# Patient Record
Sex: Female | Born: 1965
Health system: Southern US, Community
[De-identification: ages and names within clinical notes are randomized; demographics above are authoritative.]

## PROBLEM LIST (undated history)

## (undated) DIAGNOSIS — M1990 Unspecified osteoarthritis, unspecified site: Secondary | ICD-10-CM

## (undated) DIAGNOSIS — T7840XA Allergy, unspecified, initial encounter: Secondary | ICD-10-CM

## (undated) DIAGNOSIS — M722 Plantar fascial fibromatosis: Secondary | ICD-10-CM

## (undated) DIAGNOSIS — M7751 Other enthesopathy of right foot: Secondary | ICD-10-CM

## (undated) HISTORY — DX: Unspecified osteoarthritis, unspecified site: M19.90

## (undated) HISTORY — DX: Plantar fascial fibromatosis: M72.2

## (undated) HISTORY — PX: BREAST CYST ASPIRATION: SHX578

## (undated) HISTORY — DX: Other enthesopathy of right foot and ankle: M77.51

## (undated) HISTORY — PX: COLONOSCOPY: SHX174

## (undated) HISTORY — DX: Allergy, unspecified, initial encounter: T78.40XA

## (undated) HISTORY — PX: OTHER SURGICAL HISTORY: SHX169

## (undated) HISTORY — PX: POLYPECTOMY: SHX149

---

## 2016-02-20 ENCOUNTER — Other Ambulatory Visit: Payer: Self-pay | Admitting: Medical

## 2016-02-20 DIAGNOSIS — Z1231 Encounter for screening mammogram for malignant neoplasm of breast: Secondary | ICD-10-CM

## 2016-02-22 ENCOUNTER — Ambulatory Visit
Admission: RE | Admit: 2016-02-22 | Discharge: 2016-02-22 | Disposition: A | Discharge status: Home / Self Care | Payer: BLUE CROSS/BLUE SHIELD | Source: Ambulatory Visit | Attending: Medical | Admitting: Medical

## 2016-02-22 DIAGNOSIS — Z1231 Encounter for screening mammogram for malignant neoplasm of breast: Secondary | ICD-10-CM | POA: Insufficient documentation

## 2016-03-13 ENCOUNTER — Ambulatory Visit
Admission: RE | Admit: 2016-03-13 | Discharge: 2016-03-13 | Disposition: A | Discharge status: Home / Self Care | Payer: BLUE CROSS/BLUE SHIELD | Source: Ambulatory Visit | Attending: Medical | Admitting: Medical

## 2016-03-13 ENCOUNTER — Other Ambulatory Visit: Payer: Self-pay | Admitting: Medical

## 2016-03-13 DIAGNOSIS — M25561 Pain in right knee: Secondary | ICD-10-CM

## 2016-03-13 DIAGNOSIS — M1711 Unilateral primary osteoarthritis, right knee: Secondary | ICD-10-CM | POA: Insufficient documentation

## 2016-04-30 ENCOUNTER — Ambulatory Visit: Payer: BLUE CROSS/BLUE SHIELD | Attending: Medical | Admitting: Physical Therapy

## 2016-04-30 DIAGNOSIS — M25562 Pain in left knee: Secondary | ICD-10-CM | POA: Diagnosis present

## 2016-04-30 DIAGNOSIS — R262 Difficulty in walking, not elsewhere classified: Secondary | ICD-10-CM | POA: Insufficient documentation

## 2016-04-30 DIAGNOSIS — M25561 Pain in right knee: Secondary | ICD-10-CM | POA: Insufficient documentation

## 2016-04-30 NOTE — Patient Instructions (Signed)
All exercises provided were adapted from [email or web address]. Patient was provided a written handout with pictures as described. Any additional cues were manually entered in to handout and copied in to this document.  Patellar Mobilization  Relax the knee cap and gently move the knee cap (painlessly) by lifting anf gliding the bone.    SEATED CALF STRETCH - SOLEUS  While sitting, use a towel or other strap looped around your foot. Gently pull your ankle back until a stretch is felt along the back of your lower leg.   Your knee should be slightly bent the entire time.   PRONE KNEE FLEXION STRETCH WITH BELT - ANKLE ANCHORED  Start by lying on your stomach with a strap or 2 belts linked together and looped it around your affected side ankle.  Next, use the belt to pull the knee into a bent position allowing for a stretch as shown.

## 2016-05-01 NOTE — Therapy (Addendum)
[place] [facility] [address] [place], [address] Phone: [phone]   Fax:  [phone]  Physical Therapy Evaluation  Patient Details  Name: Meredith Spence MRN: [id number] Date of Birth: 10/03/1966 No Data Recorded  Encounter Date: 04/30/2016      PT End of Session - 04/30/16 [id number]    Visit Number 1   Number of Visits 9   Date for PT Re-Evaluation 06/04/16   PT Start Time 0803   PT Stop Time [id number]   PT Time Calculation (min) 52 min   Activity Tolerance Patient tolerated treatment well   Behavior During Therapy [facility] for tasks assessed/performed      No past medical history on file.  Past Surgical History  Procedure Laterality Date  . Breast cyst aspiration Left     neg    There were no vitals filed for this visit.       Subjective Assessment - 04/30/16 0943    Subjective Patient reports she began developing bilateral knee pain in the past 2-3 years. She has been active up until this point, including using weight machines and ellipticals. She prefers the elliptical, however she believes that excessive use caused her to have increased knee pain. She reports no trauma, chronic low back pain which seems unrelated to knee pain. Denies numbness/tingling or previous LE injury.    Limitations Walking   Diagnostic tests X-ray on R, no acute findings, some degenerative changes.    Patient Stated Goals To get back to a gym routine to lose some of the weight she has gained.    Currently in Pain? No/denies  No pain at rest, pain only with ascending/descending steps.             [facility] PT Assessment - 04/30/16 0944    Assessment   Medical Diagnosis --  Patellofemoral pain syndrome bilateral   Precautions   Precautions None   Restrictions   Weight Bearing Restrictions No   Balance Screen   Has the patient fallen in the past 6 months No   Has the patient had a decrease in activity level because of a fear of  falling?  Yes   [place] residence   Prior Function   Level of Independence Independent   Vocation Full time employment   Vocation Requirements --  Scientist, water quality   Leisure --  Reading, traveling.    Cognition   Overall Cognitive Status Within Functional Limits for tasks assessed   Observation/Other Assessments   Lower Extremity Functional Scale  63   Sensation   Light Touch Appears Intact   Step Up   Comments --  After ther-ex minimal pain   Step Down   Comments --  After ther-ex minimal pain   Sit to Stand   Comments --  Pain initially, quad dominant and valgus, after TE pain less   Strength   Overall Strength Comments --  No deficits in MMT   Palpation   Patella mobility --  Patellar mobility decreased with medial glides   Spinal mobility --  No overt hypomobility or pain   Saralyn Pilar (FABER) Test   Findings Negative   Ely's Test   Findings Positive   Side Right;Left   Step-up/Step Down    Findings Positive   Side  Right;Left      Manual DF stretching bilaterally 3 sets x 10 repetitions  Patellar mobilizations bilaterally (medial glides only) 5 rounds x 15-30"  Ely's position quadricep stretching 3 bouts x 10 repetitions on RLE **Educated patient on seated calf stretch and prone quad stretch for HEP.    Patient able to ascend/descend steps reciprocally with no HHA and minimal pain reported after session completed.                      PT Education - 04/30/16 [phone]    Education provided Yes   Education Details Contributing factors to anterior knee pain, HEP   Person(s) Educated Patient   Methods Explanation;Demonstration;Handout   Comprehension Verbalized understanding;Returned demonstration            PT Long Term Goals - 05/02/16 1102    PT LONG TERM GOAL #1   Title Patient will report an LEFs score of greater than 72/80 to demonstrate increased tolerance for ADLs.    Baseline 63/80   Time 4    Period Weeks   Status New   PT LONG TERM GOAL #2   Title Patient will ascend/descend 12 steps with no increase in pain and reciprocal pattern to complete household mobility.    Baseline Pain with all stair ascent/descent.    Time 4   Period Weeks   Status New   PT LONG TERM GOAL #3   Title Patient will participate in regular exercise program with no increase in pain to complete recreational activities.    Time 4   Period Weeks   Status New                  Plan - 04/30/16 0939    Clinical Impression Statement Patient presents with progressive insidious onset of bilateral knee pain R>L. She has a history of using an elliptical and wearing high heels and is noted to have decreased soft tissue extrensibility of her quadriceps (R>L) and ankle ROM into DF. She reports her pain is primarily with ascending/descending steps, which is alleviated after stretching program to target identified mobility deficits. She does have grinding sensation in patellofemoral joint as well, which may benefit from patellar mobilizations and alterations in motor patterning. Patient would benefit from skilled PT services to address the above deficits.    Rehab Potential Good   Clinical Impairments Affecting Rehab Potential Quick response in this session, chronic pain.   PT Frequency 2x / week   PT Duration 4 weeks   PT Treatment/Interventions Therapeutic exercise;Therapeutic activities;Manual techniques;Taping;Stair training;Gait training   PT Next Visit Plan Patellar mobilizations, educate on technique for elliptical, quad and calf stretching, glute strengthening.    PT Home Exercise Plan See patient instructions.    Consulted and Agree with Plan of Care Patient      Patient will benefit from skilled therapeutic intervention in order to improve the following deficits and impairments:  Difficulty walking, Pain, Improper body mechanics, Decreased strength, Impaired flexibility  Visit Diagnosis: Pain  in right knee - Plan: PT plan of care cert/re-cert  Pain in left knee - Plan: PT plan of care cert/re-cert  Difficulty in walking, not elsewhere classified - Plan: PT plan of care cert/re-cert     Problem List There are no active problems to display for this patient.  Kerman Passey, PT, DPT    05/01/2016, 3:45 PM  [place] [facility] [address] [place], [address] Phone: [phone]   Fax:  [phone]  Name: Meredith Spence MRN: [id number] Date of Birth: [birth year]  Late addition entry of Long term goals  Kerman Passey, PT, DPT

## 2016-05-02 ENCOUNTER — Ambulatory Visit: Payer: BLUE CROSS/BLUE SHIELD | Admitting: Physical Therapy

## 2016-05-02 DIAGNOSIS — R262 Difficulty in walking, not elsewhere classified: Secondary | ICD-10-CM

## 2016-05-02 DIAGNOSIS — M25561 Pain in right knee: Secondary | ICD-10-CM

## 2016-05-02 DIAGNOSIS — M25562 Pain in left knee: Secondary | ICD-10-CM

## 2016-05-02 NOTE — Therapy (Signed)
[place] [facility] [address] [place], [address] Phone: [phone]   Fax:  [phone]  Physical Therapy Treatment  Patient Details  Name: Meredith Spence MRN: [id number] Date of Birth: 1966/01/15 No Data Recorded  Encounter Date: 05/02/2016      PT End of Session - 05/02/16 0948    Visit Number 2   Number of Visits 9   Date for PT Re-Evaluation 06/04/16   PT Start Time 0903   PT Stop Time 0945   PT Time Calculation (min) 42 min   Activity Tolerance Patient tolerated treatment well   Behavior During Therapy [facility] for tasks assessed/performed      No past medical history on file.  Past Surgical History  Procedure Laterality Date  . Breast cyst aspiration Left     neg    There were no vitals filed for this visit.      Subjective Assessment - 05/02/16 0922    Subjective Patient reports she had some soreness in her calves after initial session, which has reduced. She reports she is experiencing less pain with stair ascent/descent. She has been diligent with HEP, noticing more L knee pain than R currently, she reports possibly because she is performing stretches on R moreso.    Limitations Walking   Diagnostic tests X-ray on R, no acute findings, some degenerative changes.    Patient Stated Goals To get back to a gym routine to lose some of the weight she has gained.    Currently in Pain? No/denies  Not at rest, just with stair ascent.       Patellar mobilizations to medial direction bilaterally 4 bouts on R side, 2 on L side. Notable reduction in resistance after repetitions on R side, no pain with either direction.   Sidelying clamshells 2 sets x 12 repetitions bilaterally with red t-band   Stair ascent/descent -- noted to have increased R knee pain on ascent, no pain on descent.   Standing quad stretches 2 bouts x 8 repetitions with 5" holds bilaterally ( reported decreased pain, particularly in R knee  afterwards)  Assessed squats to chair, narrow BOS with valgus noted. Cued for wider stance which reduced grinding in her knees, added R tband around her knees x 10 repetitions (felt in glute med as well) No pain reported.   Educated patient on performing standing knee flexion stretch using her hands to complete x 5 on R side, noted a stretch but not pain in her RLE.                             PT Education - 05/02/16 1058    Education provided Yes   Education Details Progression of HEP to clamshells, standing knee flexion stretch.    Person(s) Educated Patient   Methods Explanation;Demonstration;Handout   Comprehension Verbalized understanding;Returned demonstration             PT Long Term Goals - 05/02/16 1102    PT LONG TERM GOAL #1   Title Patient will report an LEFs score of greater than 72/80 to demonstrate increased tolerance for ADLs.    Baseline 63/80   Time 4   Period Weeks   Status New   PT LONG TERM GOAL #2   Title Patient will ascend/descend 12 steps with no increase in pain and reciprocal pattern to complete household mobility.    Baseline Pain with all stair ascent/descent.  Time 4   Period Weeks   Status New   PT LONG TERM GOAL #3   Title Patient will participate in regular exercise program with no increase in pain to complete recreational activities.    Time 4   Period Weeks   Status New               Plan - 05/02/16 1105    Clinical Impression Statement Patient reports decreased R knee pain after knee flexion stretching on this session. She also demonstrates decreased grinding with wider stance sit to stands, continues with narrow stance sit to stand and stair ascent/descent. She was provided with initial postero-lateral hip strengthening program today to address hip strength defiicts and tolerated well.    Rehab Potential Good   Clinical Impairments Affecting Rehab Potential Quick response in this session, chronic pain.    PT Frequency 2x / week   PT Duration 4 weeks   PT Treatment/Interventions Therapeutic exercise;Therapeutic activities;Manual techniques;Taping;Stair training;Gait training   PT Next Visit Plan Patellar mobilizations, educate on technique for elliptical, quad and calf stretching, glute strengthening.    PT Home Exercise Plan See patient instructions.    Consulted and Agree with Plan of Care Patient      Patient will benefit from skilled therapeutic intervention in order to improve the following deficits and impairments:  Difficulty walking, Pain, Improper body mechanics, Decreased strength, Impaired flexibility  Visit Diagnosis: Pain in left knee  Pain in right knee  Difficulty in walking, not elsewhere classified     Problem List There are no active problems to display for this patient.  Kerman Passey, PT, DPT    05/02/2016, 11:55 AM  [place] [facility] [address] [place], [address] Phone: [phone]   Fax:  [phone]  Name: Urja Bustos MRN: [id number] Date of Birth: 1966-03-09

## 2016-05-07 ENCOUNTER — Encounter: Payer: BLUE CROSS/BLUE SHIELD | Admitting: Physical Therapy

## 2016-05-09 ENCOUNTER — Ambulatory Visit: Payer: BLUE CROSS/BLUE SHIELD | Admitting: Physical Therapy

## 2016-05-09 ENCOUNTER — Encounter: Payer: BLUE CROSS/BLUE SHIELD | Admitting: Physical Therapy

## 2016-05-09 DIAGNOSIS — R262 Difficulty in walking, not elsewhere classified: Secondary | ICD-10-CM

## 2016-05-09 DIAGNOSIS — M25561 Pain in right knee: Secondary | ICD-10-CM

## 2016-05-09 DIAGNOSIS — M25562 Pain in left knee: Secondary | ICD-10-CM

## 2016-05-13 NOTE — Therapy (Signed)
[place] [facility] [address] [place], [address] Phone: [phone]   Fax:  [phone]  Physical Therapy Treatment  Patient Details  Name: Weldon Sohmer MRN: [id number] Date of Birth: November 14, 1966 No Data Recorded  Encounter Date: 05/09/2016      PT End of Session - 05/13/16 0958    Visit Number 3   Number of Visits 9   Date for PT Re-Evaluation 06/04/16   PT Start Time [id number]   PT Stop Time 1835   PT Time Calculation (min) 40 min   Activity Tolerance Patient tolerated treatment well   Behavior During Therapy [facility] for tasks assessed/performed      No past medical history on file.  Past Surgical History  Procedure Laterality Date  . Breast cyst aspiration Left     neg    There were no vitals filed for this visit.      Subjective Assessment - 05/13/16 1006    Subjective Patient reports she has been able to go up and down the steps without pain at home. She can notice    Limitations Walking   Diagnostic tests X-ray on R, no acute findings, some degenerative changes.    Patient Stated Goals To get back to a gym routine to lose some of the weight she has gained.    Currently in Pain? No/denies           Standing hip abduction x 12 for 2 sets with red- tband and unilateral HHA.   Side planks x 10" fot 3 sets bilaterally (patient found quite challenging, cuing for use of other hand for support, keeping trunk in neutral).   Band walks with red theraband x 10 repetitions for 2 rounds, completed 2 sets total. -- Patient reported she could feel this activating her gluteal and postero-lateral hip musculature.   Deadlift technique with 20# KB, used PVC pipe to reinforce 3 points of contact and maintaining vertical trunk position  As she was initially noted to be rounding in lumbar spine. With cuing and pictures provided patient was able to complete with improved technique, no pain in lumbar spine and  activation of glutes/hamstrings to complete lift. Squatting continues to be mildly uncomfortable for her.   Single leg deadlifts 3 sets x 10 repetitions with single HHA, cuing for maintaining straight line between ankle and ear   Assessed stair ascent/descent. No pain with descent in this session, very minimal with ascent. Educated patient to have heel down and more upright chest, though this did not necessarily change her symptoms on ascent.                        PT Education - 05/13/16 0957    Education provided Yes   Education Details Progressed HEP, educated to begin deadlifting items to pick up off the ground rather than squatting.    Person(s) Educated Patient   Methods Explanation;Demonstration;Handout   Comprehension Returned demonstration;Verbalized understanding             PT Long Term Goals - 05/02/16 1102    PT LONG TERM GOAL #1   Title Patient will report an LEFs score of greater than 72/80 to demonstrate increased tolerance for ADLs.    Baseline 63/80   Time 4   Period Weeks   Status New   PT LONG TERM GOAL #2   Title Patient will ascend/descend 12 steps with no increase in pain and reciprocal pattern  to complete household mobility.    Baseline Pain with all stair ascent/descent.    Time 4   Period Weeks   Status New   PT LONG TERM GOAL #3   Title Patient will participate in regular exercise program with no increase in pain to complete recreational activities.    Time 4   Period Weeks   Status New               Plan - 05/13/16 [id number]    Clinical Impression Statement Patient is reporting significant improvement in symptoms, she does struggle to complete sit to stand on single leg without UE support. She is progressing well with postero-lateral hip strengthening program, and is now able to progress to static and dynamic resisted upright strengthening activities.    Rehab Potential Good   Clinical Impairments Affecting Rehab Potential  Quick response in this session, chronic pain.   PT Frequency 2x / week   PT Duration 4 weeks   PT Treatment/Interventions Therapeutic exercise;Therapeutic activities;Manual techniques;Taping;Stair training;Gait training   PT Next Visit Plan Patellar mobilizations, educate on technique for elliptical, quad and calf stretching, glute strengthening.    PT Home Exercise Plan Sit to stands single leg, single leg deadlifts, patellar mobilizations, band walks, side planks.    Consulted and Agree with Plan of Care Patient      Patient will benefit from skilled therapeutic intervention in order to improve the following deficits and impairments:  Difficulty walking, Pain, Improper body mechanics, Decreased strength, Impaired flexibility  Visit Diagnosis: Pain in left knee  Pain in right knee  Difficulty in walking, not elsewhere classified     Problem List There are no active problems to display for this patient.  Kerman Passey, PT, DPT    05/13/2016, 10:06 AM  [place] [facility] [address] [place], [address] Phone: [phone]   Fax:  [phone]  Name: Klover Morss MRN: [id number] Date of Birth: [birth year]

## 2016-05-16 ENCOUNTER — Ambulatory Visit: Payer: BLUE CROSS/BLUE SHIELD | Admitting: Physical Therapy

## 2016-05-16 DIAGNOSIS — M25561 Pain in right knee: Secondary | ICD-10-CM | POA: Diagnosis not present

## 2016-05-16 DIAGNOSIS — R262 Difficulty in walking, not elsewhere classified: Secondary | ICD-10-CM

## 2016-05-16 DIAGNOSIS — M25562 Pain in left knee: Secondary | ICD-10-CM

## 2016-05-19 NOTE — Therapy (Signed)
[place] [facility] [address] [place], [address] Phone: [phone]   Fax:  [phone]  Physical Therapy Treatment  Patient Details  Name: Meredith Spence MRN: [id number] Date of Birth: 12-27-1965 No Data Recorded  Encounter Date: 05/16/2016      PT End of Session - 05/19/16 2258    Visit Number 4   Number of Visits 9   Date for PT Re-Evaluation 06/04/16   PT Start Time 9924   PT Stop Time 1800   PT Time Calculation (min) 15 min   Activity Tolerance Patient tolerated treatment well   Behavior During Therapy [facility] for tasks assessed/performed      No past medical history on file.  Past Surgical History  Procedure Laterality Date  . Breast cyst aspiration Left     neg    There were no vitals filed for this visit.      Subjective Assessment - 05/19/16 2302    Subjective Patient informs PT she is now able to ascend/descend steps at home with no antalgic gait, even her husband has noticed. She has had one minor incident of knee pain over the past 1-2 weeks, no other reports of pain. She states she feels she no longer needs therapy and can resume her previous activities.    Limitations Walking   Diagnostic tests X-ray on R, no acute findings, some degenerative changes.    Patient Stated Goals To get back to a gym routine to lose some of the weight she has gained.    Currently in Pain? No/denies            Patient completed LEFS - 76/80 She was able to ascend/descend 16 steps without use of UEs with no reports of pain.  Discussed maintenance program, specifically to continue with HEP every other day or every 3 days as well as returning to previous level of activity. She was instructed to try rowing ergometer as tolerated in lieu of elliptical.                      PT Education - 05/19/16 2258    Education provided Yes   Education Details To continue HEP, she can decline to every other  day. Use rowing ergometer rather than elliptical if possible.    Person(s) Educated Patient   Methods Explanation   Comprehension Verbalized understanding             PT Long Term Goals - 05/16/16 1809    PT LONG TERM GOAL #1   Title Patient will report an LEFs score of greater than 72/80 to demonstrate increased tolerance for ADLs.    Baseline 63/80, 76/80 on 6/29   Time 4   Period Weeks   Status Achieved   PT LONG TERM GOAL #2   Title Patient will ascend/descend 12 steps with no increase in pain and reciprocal pattern to complete household mobility.    Baseline Pain with all stair ascent/descent.    Time 4   Period Weeks   Status Achieved   PT LONG TERM GOAL #3   Title Patient will participate in regular exercise program with no increase in pain to complete recreational activities.    Time 4   Period Weeks   Status Achieved               Plan - 05/19/16 2259    Clinical Impression Statement Patient is no longer having symptoms and reports marked  increase in LEFS to 76/80. She was instructed in maintenance protocol and to resume normal workout activities. Patient has met all goals.    Rehab Potential Good   Clinical Impairments Affecting Rehab Potential Quick response in this session, chronic pain.   PT Frequency 2x / week   PT Duration 4 weeks   PT Treatment/Interventions Therapeutic exercise;Therapeutic activities;Manual techniques;Taping;Stair training;Gait training   PT Next Visit Plan Patellar mobilizations, educate on technique for elliptical, quad and calf stretching, glute strengthening.    PT Home Exercise Plan Sit to stands single leg, single leg deadlifts, patellar mobilizations, band walks, side planks.    Consulted and Agree with Plan of Care Patient      Patient will benefit from skilled therapeutic intervention in order to improve the following deficits and impairments:  Difficulty walking, Pain, Improper body mechanics, Decreased strength,  Impaired flexibility  Visit Diagnosis: Pain in left knee  Pain in right knee  Difficulty in walking, not elsewhere classified     Problem List There are no active problems to display for this patient.  Kerman Passey, PT, DPT    05/19/2016, 11:02 PM  [place] [facility] [address], [place], [address] Phone: [phone]   Fax:  [phone]  Name: Koby Hartfield MRN: [id number] Date of Birth: [birth year]

## 2016-05-23 ENCOUNTER — Ambulatory Visit: Payer: BLUE CROSS/BLUE SHIELD | Admitting: Physical Therapy

## 2016-05-28 ENCOUNTER — Encounter: Payer: BLUE CROSS/BLUE SHIELD | Admitting: Physical Therapy

## 2016-06-04 ENCOUNTER — Encounter: Payer: BLUE CROSS/BLUE SHIELD | Admitting: Physical Therapy

## 2016-06-11 ENCOUNTER — Encounter: Payer: BLUE CROSS/BLUE SHIELD | Admitting: Physical Therapy

## 2016-06-18 ENCOUNTER — Encounter: Payer: BLUE CROSS/BLUE SHIELD | Admitting: Physical Therapy

## 2017-03-03 ENCOUNTER — Other Ambulatory Visit: Payer: Self-pay | Admitting: Medical

## 2017-03-03 DIAGNOSIS — Z1231 Encounter for screening mammogram for malignant neoplasm of breast: Secondary | ICD-10-CM

## 2017-03-05 ENCOUNTER — Ambulatory Visit
Admission: RE | Admit: 2017-03-05 | Discharge: 2017-03-05 | Disposition: A | Discharge status: Home / Self Care | Payer: BLUE CROSS/BLUE SHIELD | Source: Ambulatory Visit | Attending: Medical | Admitting: Medical

## 2017-03-05 DIAGNOSIS — Z1231 Encounter for screening mammogram for malignant neoplasm of breast: Secondary | ICD-10-CM | POA: Insufficient documentation

## 2017-12-17 ENCOUNTER — Ambulatory Visit: Payer: Self-pay | Admitting: Medical

## 2017-12-17 VITALS — BP 104/78 | HR 79 | Temp 97.5°F | Resp 16 | Ht 66.0 in | Wt 168.2 lb

## 2017-12-17 DIAGNOSIS — B349 Viral infection, unspecified: Secondary | ICD-10-CM

## 2017-12-17 NOTE — Patient Instructions (Signed)
Viral Illness, Adult Viruses are tiny germs that can get into a person's body and cause illness. There are many different types of viruses, and they cause many types of illness. Viral illnesses can range from mild to severe. They can affect various parts of the body. Common illnesses that are caused by a virus include colds and the flu. Viral illnesses also include serious conditions such as HIV/AIDS (human immunodeficiency virus/acquired immunodeficiency syndrome). A few viruses have been linked to certain cancers. What are the causes? Many types of viruses can cause illness. Viruses invade cells in your body, multiply, and cause the infected cells to malfunction or die. When the cell dies, it releases more of the virus. When this happens, you develop symptoms of the illness, and the virus continues to spread to other cells. If the virus takes over the function of the cell, it can cause the cell to divide and grow out of control, as is the case when a virus causes cancer. Different viruses get into the body in different ways. You can get a virus by:  Swallowing food or water that is contaminated with the virus.  Breathing in droplets that have been coughed or sneezed into the air by an infected person.  Touching a surface that has been contaminated with the virus and then touching your eyes, nose, or mouth.  Being bitten by an insect or animal that carries the virus.  Having sexual contact with a person who is infected with the virus.  Being exposed to blood or fluids that contain the virus, either through an open cut or during a transfusion.  If a virus enters your body, your body's defense system (immune system) will try to fight the virus. You may be at higher risk for a viral illness if your immune system is weak. What are the signs or symptoms? Symptoms vary depending on the type of virus and the location of the cells that it invades. Common symptoms of the main types of viral illnesses  include: Cold and flu viruses  Fever.  Headache.  Sore throat.  Muscle aches.  Nasal congestion.  Cough. Digestive system (gastrointestinal) viruses  Fever.  Abdominal pain.  Nausea.  Diarrhea. Liver viruses (hepatitis)  Loss of appetite.  Tiredness.  Yellowing of the skin (jaundice). Brain and spinal cord viruses  Fever.  Headache.  Stiff neck.  Nausea and vomiting.  Confusion or sleepiness. Skin viruses  Warts.  Itching.  Rash. Sexually transmitted viruses  Discharge.  Swelling.  Redness.  Rash. How is this treated? Viruses can be difficult to treat because they live within cells. Antibiotic medicines do not treat viruses because these drugs do not get inside cells. Treatment for a viral illness may include:  Resting and drinking plenty of fluids.  Medicines to relieve symptoms. These can include over-the-counter medicine for pain and fever, medicines for cough or congestion, and medicines to relieve diarrhea.  Antiviral medicines. These drugs are available only for certain types of viruses. They may help reduce flu symptoms if taken early. There are also many antiviral medicines for hepatitis and HIV/AIDS.  Some viral illnesses can be prevented with vaccinations. A common example is the flu shot. Follow these instructions at home: Medicines   Take over-the-counter and prescription medicines only as told by your health care provider.  If you were prescribed an antiviral medicine, take it as told by your health care provider. Do not stop taking the medicine even if you start to feel better.  Be aware   of when antibiotics are needed and when they are not needed. Antibiotics do not treat viruses. If your health care provider thinks that you may have a bacterial infection as well as a viral infection, you may get an antibiotic. ? Do not ask for an antibiotic prescription if you have been diagnosed with a viral illness. That will not make your  illness go away faster. ? Frequently taking antibiotics when they are not needed can lead to antibiotic resistance. When this develops, the medicine no longer works against the bacteria that it normally fights. General instructions  Drink enough fluids to keep your urine clear or pale yellow.  Rest as much as possible.  Return to your normal activities as told by your health care provider. Ask your health care provider what activities are safe for you.  Keep all follow-up visits as told by your health care provider. This is important. How is this prevented? Take these actions to reduce your risk of viral infection:  Eat a healthy diet and get enough rest.  Wash your hands often with soap and water. This is especially important when you are in public places. If soap and water are not available, use hand sanitizer.  Avoid close contact with friends and family who have a viral illness.  If you travel to areas where viral gastrointestinal infection is common, avoid drinking water or eating raw food.  Keep your immunizations up to date. Get a flu shot every year as told by your health care provider.  Do not share toothbrushes, nail clippers, razors, or needles with other people.  Always practice safe sex.  Contact a health care provider if:  You have symptoms of a viral illness that do not go away.  Your symptoms come back after going away.  Your symptoms get worse. Get help right away if:  You have trouble breathing.  You have a severe headache or a stiff neck.  You have severe vomiting or abdominal pain. This information is not intended to replace advice given to you by your health care provider. Make sure you discuss any questions you have with your health care provider. Document Released: 03/15/2016 Document Revised: 04/17/2016 Document Reviewed: 03/15/2016 Elsevier Interactive Patient Education  2018 [employer]  

## 2017-12-17 NOTE — Progress Notes (Signed)
   Subjective:    Patient ID: Meredith Spence, female    DOB: Feb 08, 1966, 52 y.o.   MRN: [id number]  HPI 52 yo female non acute distress started Friday with body aches , nasal congestion, discharge clear discharge, mild cough, mild productive at times just swallows the phellgm. Denies fever and chills.  Left eye tearing with pressure in left nostril. One time she blew her nose and the discharge was orange.  Review of Systems  Constitutional: Negative for chills and fever.  HENT: Positive for congestion, postnasal drip, rhinorrhea, sneezing, sore throat (initially scratchy , now gone) and voice change. Negative for ear pain, sinus pressure and sinus pain.   Eyes: Negative for discharge and itching.  Respiratory: Positive for cough. Negative for chest tightness and shortness of breath.   Cardiovascular: Negative for chest pain.  Gastrointestinal: Negative for abdominal pain.  Genitourinary: Negative for dysuria.  Musculoskeletal: Positive for myalgias.  Neurological: Positive for headaches (left sided headache frontal). Negative for dizziness, syncope and light-headedness.  Hematological: Negative for adenopathy.  Psychiatric/Behavioral: Negative for behavioral problems, self-injury and suicidal ideas. The patient is not nervous/anxious.        Objective:   Physical Exam  Constitutional: She is oriented to person, place, and time. She appears well-developed and well-nourished.  HENT:  Head: Normocephalic and atraumatic.  Right Ear: Hearing and ear canal normal. A middle ear effusion is present.  Left Ear: Hearing and ear canal normal. A middle ear effusion is present.  Nose: Rhinorrhea present.  Mouth/Throat: Uvula is midline, oropharynx is clear and moist and mucous membranes are normal.  Eyes: Conjunctivae and EOM are normal. Pupils are equal, round, and reactive to light.  Neck: Normal range of motion. Neck supple.  Cardiovascular: Normal rate, regular rhythm and normal heart sounds.   Pulmonary/Chest: Effort normal and breath sounds normal.  Musculoskeletal: Normal range of motion.  Lymphadenopathy:    She has no cervical adenopathy.  Neurological: She is alert and oriented to person, place, and time.  Skin: Skin is warm and dry.  Psychiatric: She has a normal mood and affect. Her behavior is normal. Judgment and thought content normal.  Nursing note and vitals reviewed.   Patient does not look well, but you can tell she has nasal congestion. Medial side of inside nares irritated vascular looking.      Assessment & Plan:  Viral illness  Wrote for  Z-pak 250 mg to take if color change of yellow or green in nasal discharge or cough or fever 101 or higher. If not improving in 14 days return to the clinic. Rest , fluids, OTC Zyrtec or Claritin take as directed. Take OTC Ibuprofen or Tylenol if none in Extra Strength Mucinex to help feel better.  Hand written prescription because patient was unsure of pharmacy road. Patient verbalizes understanding and has no questions at discharge.

## 2018-05-18 ENCOUNTER — Encounter: Payer: Self-pay | Admitting: Gastroenterology

## 2018-05-18 ENCOUNTER — Ambulatory Visit: Payer: BLUE CROSS/BLUE SHIELD | Admitting: Nurse Practitioner

## 2018-05-18 ENCOUNTER — Encounter: Payer: Self-pay | Admitting: Nurse Practitioner

## 2018-05-18 ENCOUNTER — Other Ambulatory Visit (INDEPENDENT_AMBULATORY_CARE_PROVIDER_SITE_OTHER): Payer: BLUE CROSS/BLUE SHIELD

## 2018-05-18 ENCOUNTER — Other Ambulatory Visit: Payer: Self-pay | Admitting: Nurse Practitioner

## 2018-05-18 VITALS — BP 116/72 | HR 77 | Temp 97.7°F | Resp 16 | Ht 66.0 in | Wt 169.0 lb

## 2018-05-18 DIAGNOSIS — Z0000 Encounter for general adult medical examination without abnormal findings: Secondary | ICD-10-CM

## 2018-05-18 DIAGNOSIS — R899 Unspecified abnormal finding in specimens from other organs, systems and tissues: Secondary | ICD-10-CM

## 2018-05-18 DIAGNOSIS — Z13220 Encounter for screening for lipoid disorders: Secondary | ICD-10-CM

## 2018-05-18 DIAGNOSIS — Z1239 Encounter for other screening for malignant neoplasm of breast: Secondary | ICD-10-CM

## 2018-05-18 DIAGNOSIS — Z1211 Encounter for screening for malignant neoplasm of colon: Secondary | ICD-10-CM | POA: Diagnosis not present

## 2018-05-18 DIAGNOSIS — Z23 Encounter for immunization: Secondary | ICD-10-CM

## 2018-05-18 DIAGNOSIS — R4582 Worries: Secondary | ICD-10-CM

## 2018-05-18 DIAGNOSIS — Z124 Encounter for screening for malignant neoplasm of cervix: Secondary | ICD-10-CM

## 2018-05-18 DIAGNOSIS — Z1231 Encounter for screening mammogram for malignant neoplasm of breast: Secondary | ICD-10-CM

## 2018-05-18 LAB — LIPID PANEL
Cholesterol: 214 mg/dL — ABNORMAL HIGH (ref 0–200)
HDL: 59.8 mg/dL (ref >39.00)
LDL Cholesterol: 139 mg/dL — ABNORMAL HIGH (ref 0–99)
NONHDL: 153.83
Total CHOL/HDL Ratio: 4
Triglycerides: 72 mg/dL (ref 0.0–149.0)
VLDL: 14.4 mg/dL (ref 0.0–40.0)

## 2018-05-18 LAB — COMPREHENSIVE METABOLIC PANEL
ALBUMIN: 4.2 g/dL (ref 3.5–5.2)
ALK PHOS: 42 U/L (ref 39–117)
ALT: 12 U/L (ref 0–35)
AST: 32 U/L (ref 0–37)
BILIRUBIN TOTAL: 0.6 mg/dL (ref 0.2–1.2)
BUN: 17 mg/dL (ref 6–23)
CALCIUM: 8.7 mg/dL (ref 8.4–10.5)
CO2: 27 mEq/L (ref 19–32)
CREATININE: 0.62 mg/dL (ref 0.40–1.20)
Chloride: 103 mEq/L (ref 96–112)
GFR: 107.82 mL/min (ref >60.00)
Glucose, Bld: 105 mg/dL — ABNORMAL HIGH (ref 70–99)
Potassium: 4.1 mEq/L (ref 3.5–5.1)
Sodium: 137 mEq/L (ref 135–145)
TOTAL PROTEIN: 6.7 g/dL (ref 6.0–8.3)

## 2018-05-18 LAB — CBC
HCT: 44.9 % (ref 36.0–46.0)
Hemoglobin: 15.6 g/dL — ABNORMAL HIGH (ref 12.0–15.0)
MCHC: 34.8 g/dL (ref 30.0–36.0)
MCV: 94.1 fl (ref 78.0–100.0)
PLATELETS: 263 10*3/uL (ref 150.0–400.0)
RBC: 4.77 Mil/uL (ref 3.87–5.11)
RDW: 12.4 % (ref 11.5–15.5)
WBC: 6.1 10*3/uL (ref 4.0–10.5)

## 2018-05-18 LAB — TSH: TSH: 0.89 u[IU]/mL (ref 0.35–4.50)

## 2018-05-18 NOTE — Patient Instructions (Addendum)
Please head downstairs for lab work/x-rays. If any of your test results are critically abnormal, you will be contacted right away. Otherwise, I will contact you within a week about your test results and any recommendations for abnormalities.  I have placed a referral to gastroenterology, gynecology and order for mammogram. Our office will call you to schedule this appointment. You should hear from our office in 7-10 days. Please let me know if you have not hear of these appointments/referrals after this time.  I will plan to see you back in 1 year for you annual physical, or sooner if needed.  Health Maintenance, Female Adopting a healthy lifestyle and getting preventive care can go a long way to promote health and wellness. Talk with your health care provider about what schedule of regular examinations is right for you. This is a good chance for you to check in with your provider about disease prevention and staying healthy. In between checkups, there are plenty of things you can do on your own. Experts have done a lot of research about which lifestyle changes and preventive measures are most likely to keep you healthy. Ask your health care provider for more information. Weight and diet Eat a healthy diet  Be sure to include plenty of vegetables, fruits, low-fat dairy products, and lean protein.  Do not eat a lot of foods high in solid fats, added sugars, or salt.  Get regular exercise. This is one of the most important things you can do for your health. ? Most adults should exercise for at least 150 minutes each week. The exercise should increase your heart rate and make you sweat (moderate-intensity exercise). ? Most adults should also do strengthening exercises at least twice a week. This is in addition to the moderate-intensity exercise.  Maintain a healthy weight  Body mass index (BMI) is a measurement that can be used to identify possible weight problems. It estimates body fat based on  height and weight. Your health care provider can help determine your BMI and help you achieve or maintain a healthy weight.  For females 51 years of age and older: ? A BMI below 18.5 is considered underweight. ? A BMI of 18.5 to 24.9 is normal. ? A BMI of 25 to 29.9 is considered overweight. ? A BMI of 30 and above is considered obese.  Watch levels of cholesterol and blood lipids  You should start having your blood tested for lipids and cholesterol at 51 years of age, then have this test every 5 years.  You may need to have your cholesterol levels checked more often if: ? Your lipid or cholesterol levels are high. ? You are older than 51 years of age. ? You are at high risk for heart disease.  Cancer screening Lung Cancer  Lung cancer screening is recommended for adults 90-27 years old who are at high risk for lung cancer because of a history of smoking.  A yearly low-dose CT scan of the lungs is recommended for people who: ? Currently smoke. ? Have quit within the past 15 years. ? Have at least a 30-pack-year history of smoking. A pack year is smoking an average of one pack of cigarettes a day for 1 year.  Yearly screening should continue until it has been 15 years since you quit.  Yearly screening should stop if you develop a health problem that would prevent you from having lung cancer treatment.  Breast Cancer  Practice breast self-awareness. This means understanding how your breasts  normally appear and feel.  It also means doing regular breast self-exams. Let your health care provider know about any changes, no matter how small.  If you are in your 20s or 30s, you should have a clinical breast exam (CBE) by a health care provider every 1-3 years as part of a regular health exam.  If you are 37 or older, have a CBE every year. Also consider having a breast X-ray (mammogram) every year.  If you have a family history of breast cancer, talk to your health care provider  about genetic screening.  If you are at high risk for breast cancer, talk to your health care provider about having an MRI and a mammogram every year.  Breast cancer gene (BRCA) assessment is recommended for women who have family members with BRCA-related cancers. BRCA-related cancers include: ? Breast. ? Ovarian. ? Tubal. ? Peritoneal cancers.  Results of the assessment will determine the need for genetic counseling and BRCA1 and BRCA2 testing.  Cervical Cancer Your health care provider may recommend that you be screened regularly for cancer of the pelvic organs (ovaries, uterus, and vagina). This screening involves a pelvic examination, including checking for microscopic changes to the surface of your cervix (Pap test). You may be encouraged to have this screening done every 3 years, beginning at age 7.  For women ages 73-65, health care providers may recommend pelvic exams and Pap testing every 3 years, or they may recommend the Pap and pelvic exam, combined with testing for human papilloma virus (HPV), every 5 years. Some types of HPV increase your risk of cervical cancer. Testing for HPV may also be done on women of any age with unclear Pap test results.  Other health care providers may not recommend any screening for nonpregnant women who are considered low risk for pelvic cancer and who do not have symptoms. Ask your health care provider if a screening pelvic exam is right for you.  If you have had past treatment for cervical cancer or a condition that could lead to cancer, you need Pap tests and screening for cancer for at least 20 years after your treatment. If Pap tests have been discontinued, your risk factors (such as having a new sexual partner) need to be reassessed to determine if screening should resume. Some women have medical problems that increase the chance of getting cervical cancer. In these cases, your health care provider may recommend more frequent screening and Pap  tests.  Colorectal Cancer  This type of cancer can be detected and often prevented.  Routine colorectal cancer screening usually begins at 51 years of age and continues through 51 years of age.  Your health care provider may recommend screening at an earlier age if you have risk factors for colon cancer.  Your health care provider may also recommend using home test kits to check for hidden blood in the stool.  A small camera at the end of a tube can be used to examine your colon directly (sigmoidoscopy or colonoscopy). This is done to check for the earliest forms of colorectal cancer.  Routine screening usually begins at age 19.  Direct examination of the colon should be repeated every 5-10 years through 51 years of age. However, you may need to be screened more often if early forms of precancerous polyps or small growths are found.  Skin Cancer  Check your skin from head to toe regularly.  Tell your health care provider about any new moles or changes in moles,  especially if there is a change in a mole's shape or color.  Also tell your health care provider if you have a mole that is larger than the size of a pencil eraser.  Always use sunscreen. Apply sunscreen liberally and repeatedly throughout the day.  Protect yourself by wearing long sleeves, pants, a wide-brimmed hat, and sunglasses whenever you are outside.  Heart disease, diabetes, and high blood pressure  High blood pressure causes heart disease and increases the risk of stroke. High blood pressure is more likely to develop in: ? People who have blood pressure in the high end of the normal range (130-139/85-89 mm Hg). ? People who are overweight or obese. ? People who are African American.  If you are 53-16 years of age, have your blood pressure checked every 3-5 years. If you are 29 years of age or older, have your blood pressure checked every year. You should have your blood pressure measured twice-once when you are at  a hospital or clinic, and once when you are not at a hospital or clinic. Record the average of the two measurements. To check your blood pressure when you are not at a hospital or clinic, you can use: ? An automated blood pressure machine at a pharmacy. ? A home blood pressure monitor.  If you are between 45 years and 34 years old, ask your health care provider if you should take aspirin to prevent strokes.  Have regular diabetes screenings. This involves taking a blood sample to check your fasting blood sugar level. ? If you are at a normal weight and have a low risk for diabetes, have this test once every three years after 51 years of age. ? If you are overweight and have a high risk for diabetes, consider being tested at a younger age or more often. Preventing infection Hepatitis B  If you have a higher risk for hepatitis B, you should be screened for this virus. You are considered at high risk for hepatitis B if: ? You were born in a country where hepatitis B is common. Ask your health care provider which countries are considered high risk. ? Your parents were born in a high-risk country, and you have not been immunized against hepatitis B (hepatitis B vaccine). ? You have HIV or AIDS. ? You use needles to inject street drugs. ? You live with someone who has hepatitis B. ? You have had sex with someone who has hepatitis B. ? You get hemodialysis treatment. ? You take certain medicines for conditions, including cancer, organ transplantation, and autoimmune conditions.  Hepatitis C  Blood testing is recommended for: ? Everyone born from [birth year] through 1964. ? Anyone with known risk factors for hepatitis C.  Sexually transmitted infections (STIs)  You should be screened for sexually transmitted infections (STIs) including gonorrhea and chlamydia if: ? You are sexually active and are younger than 52 years of age. ? You are older than 51 years of age and your health care provider tells  you that you are at risk for this type of infection. ? Your sexual activity has changed since you were last screened and you are at an increased risk for chlamydia or gonorrhea. Ask your health care provider if you are at risk.  If you do not have HIV, but are at risk, it may be recommended that you take a prescription medicine daily to prevent HIV infection. This is called pre-exposure prophylaxis (PrEP). You are considered at risk if: ? You are sexually  active and do not regularly use condoms or know the HIV status of your partner(s). ? You take drugs by injection. ? You are sexually active with a partner who has HIV.  Talk with your health care provider about whether you are at high risk of being infected with HIV. If you choose to begin PrEP, you should first be tested for HIV. You should then be tested every 3 months for as long as you are taking PrEP. Pregnancy  If you are premenopausal and you may become pregnant, ask your health care provider about preconception counseling.  If you may become pregnant, take 400 to 800 micrograms (mcg) of folic acid every day.  If you want to prevent pregnancy, talk to your health care provider about birth control (contraception). Osteoporosis and menopause  Osteoporosis is a disease in which the bones lose minerals and strength with aging. This can result in serious bone fractures. Your risk for osteoporosis can be identified using a bone density scan.  If you are 65 years of age or older, or if you are at risk for osteoporosis and fractures, ask your health care provider if you should be screened.  Ask your health care provider whether you should take a calcium or vitamin D supplement to lower your risk for osteoporosis.  Menopause may have certain physical symptoms and risks.  Hormone replacement therapy may reduce some of these symptoms and risks. Talk to your health care provider about whether hormone replacement therapy is right for  you. Follow these instructions at home:  Schedule regular health, dental, and eye exams.  Stay current with your immunizations.  Do not use any tobacco products including cigarettes, chewing tobacco, or electronic cigarettes.  If you are pregnant, do not drink alcohol.  If you are breastfeeding, limit how much and how often you drink alcohol.  Limit alcohol intake to no more than 1 drink per day for nonpregnant women. One drink equals 12 ounces of beer, 5 ounces of wine, or 1 ounces of hard liquor.  Do not use street drugs.  Do not share needles.  Ask your health care provider for help if you need support or information about quitting drugs.  Tell your health care provider if you often feel depressed.  Tell your health care provider if you have ever been abused or do not feel safe at home. This information is not intended to replace advice given to you by your health care provider. Make sure you discuss any questions you have with your health care provider. Document Released: 05/20/2011 Document Revised: 04/11/2016 Document Reviewed: 08/08/2015 Elsevier Interactive Patient Education  [employer].

## 2018-05-18 NOTE — Progress Notes (Signed)
Name: Meredith Spence   MRN: [id number]    DOB: July 31, 1966   Date:05/18/2018       Progress Note  Subjective  Chief Complaint  Chief Complaint  Patient presents with  . Establish Care    CPE, fasting     HPI  Meredith Spence is establishing as a new patient to our practice, prior PCP in [place]. Since moving to [place], She has been follwing with womens health provider in [place] who recently stopped practicing, and she would like referral for new GYN today. She is not maintained on any daily medications  Patient presents for annual CPE.  Diet, Exercise: low carb diet, recently gained some weight due to death in family and vacation; trying to routinely walk and go to gym.  USPSTF grade A and B recommendations  Depression: no concerns today No flowsheet data found.   Hypertension: BP Readings from Last 3 Encounters:  05/18/18 116/72  12/17/17 104/78   Obesity: Wt Readings from Last 3 Encounters:  05/18/18 169 lb (76.7 kg)  12/17/17 168 lb 3.2 oz (76.3 kg)   BMI Readings from Last 3 Encounters:  05/18/18 27.28 kg/m  12/17/17 27.15 kg/m    Alcohol: a few drinks on the weekend-fri, sat,sun Tobacco use: no, never  HIV: screening done in past  STD testing and prevention (chl/gon/syphilis): no concerns, declines screening  Intimate partner violence: declines, feels safe Menstrual History/LMP/Abnormal Bleeding: hx abnormal heavy vaginal bleeding which has been controlled since IUD placement by her prior GYN provider, will place referral to new GYN today for continued management of IUD and annual womens health care needs Incontinence Symptoms: denies  Vaccinations: TDAP today  Advanced Care Planning: A voluntary discussion about advance care planning including the explanation and discussion of advance directives.  Discussed health care proxy and Living will, and the patient DOES NOT have a living will at present time. If patient does have living will, I have requested they  bring this to the clinic to be scanned in to their chart.  Breast cancer: mammogram overdue, order placed today Cervical cancer screening:  Referral to GYN provider for annual womens health care and IUD management placed today  Lipids: lipid panel today No results found for: CHOL No results found for: HDL No results found for: LDLCALC No results found for: TRIG No results found for: CHOLHDL No results found for: LDLDIRECT  Glucose:  No results found for: GLUCOSE, GLUCAP  Skin cancer: routinely wears sunscreen  Colorectal cancer: No personal history of colon cancer, unknown family history, no abdominal pain, no bowel changes, no rectal bleeding. Colonoscopy referral placed today.  Aspirin: not indicated ECG: not indicated   Patient Active Problem List   Diagnosis Date Noted  . Routine general medical examination at a health care facility 05/18/2018    Past Surgical History:  Procedure Laterality Date  . BREAST CYST ASPIRATION Left    neg    Family History  Adopted: Yes    Social History   Socioeconomic History  . Marital status: Married    Spouse name: Not on file  . Number of children: Not on file  . Years of education: Not on file  . Highest education level: Not on file  Occupational History  . Not on file  Social Needs  . Financial resource strain: Not on file  . Food insecurity:    Worry: Not on file    Inability: Not on file  . Transportation needs:    Medical: Not on file  Non-medical: Not on file  Tobacco Use  . Smoking status: Never Smoker  . Smokeless tobacco: Never Used  Substance and Sexual Activity  . Alcohol use: Yes  . Drug use: Not Currently  . Sexual activity: Not on file  Lifestyle  . Physical activity:    Days per week: Not on file    Minutes per session: Not on file  . Stress: Not on file  Relationships  . Social connections:    Talks on phone: Not on file    Gets together: Not on file    Attends religious service: Not on  file    Active member of club or organization: Not on file    Attends meetings of clubs or organizations: Not on file    Relationship status: Not on file  . Intimate partner violence:    Fear of current or ex partner: Not on file    Emotionally abused: Not on file    Physically abused: Not on file    Forced sexual activity: Not on file  Other Topics Concern  . Not on file  Social History Narrative  . Not on file     Current Outpatient Medications:  .  levonorgestrel (MIRENA) 20 MCG/24HR IUD, 1 each by Intrauterine route once., Disp: , Rfl:   No Known Allergies   ROS  Constitutional: Negative for fever or weight change.  Respiratory: Negative for cough and shortness of breath.   Cardiovascular: Negative for chest pain or palpitations.  Gastrointestinal: Negative for abdominal pain, no bowel changes.  Musculoskeletal: Negative for gait problem or joint swelling.  Skin: Negative for rash.  Neurological: Negative for dizziness or headache.  No other specific complaints in a complete review of systems (except as listed in HPI above).   Objective  Vitals:   05/18/18 0958  BP: 116/72  Pulse: 77  Resp: 16  Temp: 97.7 F (36.5 C)  TempSrc: Oral  SpO2: 95%  Weight: 169 lb (76.7 kg)  Height: 5\' 6"  (1.676 m)    Body mass index is 27.28 kg/m.  Physical Exam Vital signs reviewed. Constitutional: Patient appears well-developed and well-nourished. No distress.  HENT: Head: Normocephalic and atraumatic. Ears: B TMs ok, no erythema or effusion; Nose: Nose normal. Mouth/Throat: Oropharynx is clear and moist. No oropharyngeal exudate.  Eyes: Conjunctivae and EOM are normal. Pupils are equal, round, and reactive to light. No scleral icterus.  Neck: Normal range of motion. Neck supple. No cervical adenopathy. No thyromegaly present.  Cardiovascular: Normal rate, regular rhythm and normal heart sounds.  No murmur heard. No BLE edema. Distal pulses intact Pulmonary/Chest: Effort  normal and breath sounds normal. No respiratory distress. Abdominal: Soft. Bowel sounds are normal, no distension. There is no tenderness. no masses Breast: defd to mammogram FEMALE GENITALIA:  defd to GYN Musculoskeletal: Normal range of motion, No gross deformities Neurological: he is alert and oriented to person, place, and time. No cranial nerve deficit. Coordination, balance, strength, speech and gait are normal.  Skin: Skin is warm and dry. No rash noted. No erythema.  Psychiatric: Patient has a normal mood and affect. behavior is normal. Judgment and thought content normal.   Assessment & Plan RTC in 1 year for CPE

## 2018-05-18 NOTE — Assessment & Plan Note (Signed)
-  USPSTF grade A and B recommendations reviewed with patient; age-appropriate recommendations, preventive care, screening tests, etc discussed and encouraged; healthy living and sunscreen use encouraged; see AVS for patient education given to patient -Discussed importance of 150 minutes of physical activity weekly, eat 6 servings of fruit/vegetables daily and drink plenty of water and avoid sweet beverages.  Follow up and care instructions discussed and provided in AVS.  -Reviewed Health Maintenance:  -Screening for cervical cancer- Ambulatory referral to Gynecology Referral to gyn for IUD management and annual womens health care -Colon cancer screening- Ambulatory referral to Gastroenterology -Screening for breast cancer- MM DIGITAL SCREENING BILATERAL; Future -Need for Tdap vaccination- Tdap vaccine greater than or equal to 7yo IM  Screening for cholesterol level Fasting today - Lipid panel; Future  Routine general medical examination at a health care facility - CBC; Future - Comprehensive metabolic panel; Future - TSH; Future-Worries

## 2018-05-26 ENCOUNTER — Other Ambulatory Visit: Payer: Self-pay | Admitting: Nurse Practitioner

## 2018-05-26 DIAGNOSIS — Z1231 Encounter for screening mammogram for malignant neoplasm of breast: Secondary | ICD-10-CM

## 2018-06-11 ENCOUNTER — Ambulatory Visit
Admission: RE | Admit: 2018-06-11 | Discharge: 2018-06-11 | Disposition: A | Discharge status: Home / Self Care | Payer: BLUE CROSS/BLUE SHIELD | Source: Ambulatory Visit | Attending: Nurse Practitioner | Admitting: Nurse Practitioner

## 2018-06-11 ENCOUNTER — Encounter: Payer: Self-pay | Admitting: Radiology

## 2018-06-11 DIAGNOSIS — Z1231 Encounter for screening mammogram for malignant neoplasm of breast: Secondary | ICD-10-CM | POA: Diagnosis not present

## 2018-06-29 ENCOUNTER — Encounter: Payer: Self-pay | Admitting: Certified Nurse Midwife

## 2018-06-30 ENCOUNTER — Ambulatory Visit: Payer: BLUE CROSS/BLUE SHIELD | Admitting: Medical

## 2018-07-03 ENCOUNTER — Encounter: Payer: Self-pay | Admitting: Certified Nurse Midwife

## 2018-07-03 ENCOUNTER — Ambulatory Visit (INDEPENDENT_AMBULATORY_CARE_PROVIDER_SITE_OTHER): Payer: BLUE CROSS/BLUE SHIELD | Admitting: Certified Nurse Midwife

## 2018-07-03 VITALS — BP 116/79 | HR 71 | Ht 66.0 in | Wt 173.3 lb

## 2018-07-03 DIAGNOSIS — Z01419 Encounter for gynecological examination (general) (routine) without abnormal findings: Secondary | ICD-10-CM

## 2018-07-03 NOTE — Progress Notes (Signed)
GYNECOLOGY ANNUAL PREVENTATIVE CARE ENCOUNTER NOTE  Subjective:   Meredith Spence is a 52 y.o. No obstetric history on file. female here for a routine annual gynecologic exam.  Current complaints: none.   Denies abnormal vaginal bleeding, discharge, pelvic pain, problems with intercourse or other gynecologic concerns. Pt states she had trauma to right breast the other day that she has a  Knot she said she cant remember what she did.    Gynecologic History No LMP recorded. (Menstrual status: IUD). Contraception: IUD mirena placed 2016 Last Pap: 04/2015. Results were: normal with negative HPV Last mammogram: 06/11/2018. Results were: normal  Obstetric History OB History  No data available    Past Medical History:  Diagnosis Date  . Allergy     Past Surgical History:  Procedure Laterality Date  . BREAST CYST ASPIRATION Left    neg    Current Outpatient Medications on File Prior to Visit  Medication Sig Dispense Refill  . levonorgestrel (MIRENA) 20 MCG/24HR IUD 1 each by Intrauterine route once.     No current facility-administered medications on file prior to visit.     No Known Allergies  Social History:  reports that she has never smoked. She has never used smokeless tobacco. She reports that she drinks alcohol. She reports that she has current or past drug history.  Drinks 2 x wk Irregular exercise No drugs or smoking  Family History  Adopted: Yes    The following portions of the patient's history were reviewed and updated as appropriate: allergies, current medications, past family history, past medical history, past social history, past surgical history and problem list.  Review of Systems Pertinent items noted in HPI and remainder of comprehensive ROS otherwise negative.   Objective:  There were no vitals taken for this visit. CONSTITUTIONAL: Well-developed, well-nourished female in no acute distress.  HENT:  Normocephalic, atraumatic, External right and left ear  normal. Oropharynx is clear and moist EYES: Conjunctivae and EOM are normal. Pupils are equal, round, and reactive to light. No scleral icterus.  NECK: Normal range of motion, supple, no masses.  Normal thyroid.  SKIN: Skin is warm and dry. No rash noted. Not diaphoretic. No erythema. No pallor. MUSCULOSKELETAL: Normal range of motion. No tenderness.  No cyanosis, clubbing, or edema.  2+ distal pulses. NEUROLOGIC: Alert and oriented to person, place, and time. Normal reflexes, muscle tone coordination. No cranial nerve deficit noted. PSYCHIATRIC: Normal mood and affect. Normal behavior. Normal judgment and thought content. CARDIOVASCULAR: Normal heart rate noted, regular rhythm RESPIRATORY: Clear to auscultation bilaterally. Effort and breath sounds normal, no problems with respiration noted. BREASTS: Symmetric in size.Right breast firm "knot" @ 11 o clock- 4 cm in size, bruising /redness noted in area. No skin changes, nipple drainage, or lymphadenopathy. ABDOMEN: Soft, normal bowel sounds, no distention noted.  No tenderness, rebound or guarding.  PELVIC: Normal appearing external genitalia; normal appearing vaginal mucosa and cervix.  No abnormal discharge noted.  Pap smear not indicated. Mirena strings present.  Normal uterine size, no other palpable masses, no uterine or adnexal tenderness.    Assessment and Plan:  Annual Well Women Exam  Pap due 2021 Labs CBC, TSH, CMP, Lipid panel done PCP7/2019. Needs Glucose-pt to follow up with PCP.  Colonoscopy: scheduled September.  Mammogram done7/2019. Follow up evaluation in 6 months if knot is still present from the trauma  Routine preventative health maintenance measures emphasized. Please refer to After Visit Summary for other counseling recommendations.   Philip Aspen, CNM

## 2018-07-03 NOTE — Progress Notes (Signed)
New pt is here for an annual exam with a pap smear. Has a Mirena in place.

## 2018-07-03 NOTE — Patient Instructions (Signed)
Preventive Care 40-64 Years, Female Preventive care refers to lifestyle choices and visits with your health care provider that can promote health and wellness. What does preventive care include?  A yearly physical exam. This is also called an annual well check.  Dental exams once or twice a year.  Routine eye exams. Ask your health care provider how often you should have your eyes checked.  Personal lifestyle choices, including: ? Daily care of your teeth and gums. ? Regular physical activity. ? Eating a healthy diet. ? Avoiding tobacco and drug use. ? Limiting alcohol use. ? Practicing safe sex. ? Taking low-dose aspirin daily starting at age 58. ? Taking vitamin and mineral supplements as recommended by your health care provider. What happens during an annual well check? The services and screenings done by your health care provider during your annual well check will depend on your age, overall health, lifestyle risk factors, and family history of disease. Counseling Your health care provider may ask you questions about your:  Alcohol use.  Tobacco use.  Drug use.  Emotional well-being.  Home and relationship well-being.  Sexual activity.  Eating habits.  Work and work Statistician.  Method of birth control.  Menstrual cycle.  Pregnancy history.  Screening You may have the following tests or measurements:  Height, weight, and BMI.  Blood pressure.  Lipid and cholesterol levels. These may be checked every 5 years, or more frequently if you are over 81 years old.  Skin check.  Lung cancer screening. You may have this screening every year starting at age 78 if you have a 30-pack-year history of smoking and currently smoke or have quit within the past 15 years.  Fecal occult blood test (FOBT) of the stool. You may have this test every year starting at age 65.  Flexible sigmoidoscopy or colonoscopy. You may have a sigmoidoscopy every 5 years or a colonoscopy  every 10 years starting at age 30.  Hepatitis C blood test.  Hepatitis B blood test.  Sexually transmitted disease (STD) testing.  Diabetes screening. This is done by checking your blood sugar (glucose) after you have not eaten for a while (fasting). You may have this done every 1-3 years.  Mammogram. This may be done every 1-2 years. Talk to your health care provider about when you should start having regular mammograms. This may depend on whether you have a family history of breast cancer.  BRCA-related cancer screening. This may be done if you have a family history of breast, ovarian, tubal, or peritoneal cancers.  Pelvic exam and Pap test. This may be done every 3 years starting at age 80. Starting at age 36, this may be done every 5 years if you have a Pap test in combination with an HPV test.  Bone density scan. This is done to screen for osteoporosis. You may have this scan if you are at high risk for osteoporosis.  Discuss your test results, treatment options, and if necessary, the need for more tests with your health care provider. Vaccines Your health care provider may recommend certain vaccines, such as:  Influenza vaccine. This is recommended every year.  Tetanus, diphtheria, and acellular pertussis (Tdap, Td) vaccine. You may need a Td booster every 10 years.  Varicella vaccine. You may need this if you have not been vaccinated.  Zoster vaccine. You may need this after age 5.  Measles, mumps, and rubella (MMR) vaccine. You may need at least one dose of MMR if you were born in  1957 or later. You may also need a second dose.  Pneumococcal 13-valent conjugate (PCV13) vaccine. You may need this if you have certain conditions and were not previously vaccinated.  Pneumococcal polysaccharide (PPSV23) vaccine. You may need one or two doses if you smoke cigarettes or if you have certain conditions.  Meningococcal vaccine. You may need this if you have certain  conditions.  Hepatitis A vaccine. You may need this if you have certain conditions or if you travel or work in places where you may be exposed to hepatitis A.  Hepatitis B vaccine. You may need this if you have certain conditions or if you travel or work in places where you may be exposed to hepatitis B.  Haemophilus influenzae type b (Hib) vaccine. You may need this if you have certain conditions.  Talk to your health care provider about which screenings and vaccines you need and how often you need them. This information is not intended to replace advice given to you by your health care provider. Make sure you discuss any questions you have with your health care provider. Document Released: 12/01/2015 Document Revised: 07/24/2016 Document Reviewed: 09/05/2015 Elsevier Interactive Patient Education  2018 [employer]. Kegel Exercises Kegel exercises help strengthen the muscles that support the rectum, vagina, small intestine, bladder, and uterus. Doing Kegel exercises can help:  Improve bladder and bowel control.  Improve sexual response.  Reduce problems and discomfort during pregnancy.  Kegel exercises involve squeezing your pelvic floor muscles, which are the same muscles you squeeze when you try to stop the flow of urine. The exercises can be done while sitting, standing, or lying down, but it is best to vary your position. Phase 1 exercises 1. Squeeze your pelvic floor muscles tight. You should feel a tight lift in your rectal area. If you are a female, you should also feel a tightness in your vaginal area. Keep your stomach, buttocks, and legs relaxed. 2. Hold the muscles tight for up to 10 seconds. 3. Relax your muscles. Repeat this exercise 50 times a day or as many times as told by your health care provider. Continue to do this exercise for at least 4-6 weeks or for as long as told by your health care provider. This information is not intended to replace advice given to you by  your health care provider. Make sure you discuss any questions you have with your health care provider. Document Released: 10/21/2012 Document Revised: 06/29/2016 Document Reviewed: 09/24/2015 Elsevier Interactive Patient Education  [employer].

## 2018-07-16 ENCOUNTER — Ambulatory Visit (AMBULATORY_SURGERY_CENTER): Payer: Self-pay

## 2018-07-16 VITALS — Ht 66.0 in | Wt 170.4 lb

## 2018-07-16 DIAGNOSIS — Z1211 Encounter for screening for malignant neoplasm of colon: Secondary | ICD-10-CM

## 2018-07-16 MED ORDER — NA SULFATE-K SULFATE-MG SULF 17.5-3.13-1.6 GM/177ML PO SOLN: 1.0000 | Freq: Once | ORAL | 0 refills | Status: AC

## 2018-07-16 NOTE — Progress Notes (Signed)
Denies allergies to eggs or soy products. Denies complication of anesthesia or sedation. Denies use of weight loss medication. Denies use of O2.   Emmi instructions declined.  

## 2018-07-17 ENCOUNTER — Encounter: Payer: Self-pay | Admitting: Gastroenterology

## 2018-07-24 ENCOUNTER — Ambulatory Visit (AMBULATORY_SURGERY_CENTER): Payer: BLUE CROSS/BLUE SHIELD | Admitting: Gastroenterology

## 2018-07-24 ENCOUNTER — Encounter: Payer: Self-pay | Admitting: Gastroenterology

## 2018-07-24 VITALS — BP 114/72 | HR 68 | Temp 98.4°F | Resp 10 | Ht 66.0 in | Wt 173.0 lb

## 2018-07-24 DIAGNOSIS — D122 Benign neoplasm of ascending colon: Secondary | ICD-10-CM

## 2018-07-24 DIAGNOSIS — D123 Benign neoplasm of transverse colon: Secondary | ICD-10-CM

## 2018-07-24 DIAGNOSIS — D120 Benign neoplasm of cecum: Secondary | ICD-10-CM

## 2018-07-24 DIAGNOSIS — Z1211 Encounter for screening for malignant neoplasm of colon: Secondary | ICD-10-CM

## 2018-07-24 MED ORDER — SODIUM CHLORIDE 0.9 % IV SOLN: 500.0000 mL | Freq: Once | INTRAVENOUS | Status: DC

## 2018-07-24 NOTE — Patient Instructions (Signed)
YOU HAD AN ENDOSCOPIC PROCEDURE TODAY AT [facility]:   Refer to the procedure report that was given to you for any specific questions about what was found during the examination.  If the procedure report does not answer your questions, please call your gastroenterologist to clarify.  If you requested that your care partner not be given the details of your procedure findings, then the procedure report has been included in a sealed envelope for you to review at your convenience later.  YOU SHOULD EXPECT: Some feelings of bloating in the abdomen. Passage of more gas than usual.  Walking can help get rid of the air that was put into your GI tract during the procedure and reduce the bloating. If you had a lower endoscopy (such as a colonoscopy or flexible sigmoidoscopy) you may notice spotting of blood in your stool or on the toilet paper. If you underwent a bowel prep for your procedure, you may not have a normal bowel movement for a few days.  Please Note:  You might notice some irritation and congestion in your nose or some drainage.  This is from the oxygen used during your procedure.  There is no need for concern and it should clear up in a day or so.  SYMPTOMS TO REPORT IMMEDIATELY:   Following lower endoscopy (colonoscopy or flexible sigmoidoscopy):  Excessive amounts of blood in the stool  Significant tenderness or worsening of abdominal pains  Swelling of the abdomen that is new, acute  Fever of 100F or higher  F For urgent or emergent issues, a gastroenterologist can be reached at any hour by calling [phone].   DIET:  We do recommend a small meal at first, but then you may proceed to your regular diet.  Drink plenty of fluids but you should avoid alcoholic beverages for 24 hours.  ACTIVITY:  You should plan to take it easy for the rest of today and you should NOT DRIVE or use heavy machinery until tomorrow (because of the sedation medicines used during the test).     FOLLOW UP: Our staff will call the number listed on your records the next business day following your procedure to check on you and address any questions or concerns that you may have regarding the information given to you following your procedure. If we do not reach you, we will leave a message.  However, if you are feeling well and you are not experiencing any problems, there is no need to return our call.  We will assume that you have returned to your regular daily activities without incident.  If any biopsies were taken you will be contacted by phone or by letter within the next 1-3 weeks.  Please call us at [phone] if you have not heard about the biopsies in 3 weeks.    SIGNATURES/CONFIDENTIALITY: You and/or your care partner have signed paperwork which will be entered into your electronic medical record.  These signatures attest to the fact that that the information above on your After Visit Summary has been reviewed and is understood.  Full responsibility of the confidentiality of this discharge information lies with you and/or your care-partner.  Polyps diverticulosis and hemorrhoid information given. Recall colonoscopy scheduled.

## 2018-07-24 NOTE — Progress Notes (Signed)
Called to room to assist during endoscopic procedure.  Patient ID and intended procedure confirmed with present staff. Received instructions for my participation in the procedure from the performing physician.  

## 2018-07-24 NOTE — Op Note (Signed)
[facility] Patient Name: Meredith Spence Procedure Date: 07/24/2018 11:04 AM MRN: [id number] Endoscopist: Mauri Pole , MD Age: 52 Referring MD:  Date of Birth: 05/21/1966 Gender: Female Account #: [id number] Procedure:                Colonoscopy Indications:              Screening for colorectal malignant neoplasm Medicines:                Monitored Anesthesia Care Procedure:                Pre-Anesthesia Assessment:                           - Prior to the procedure, a History and Physical                            was performed, and patient medications and                            allergies were reviewed. The patient's tolerance of                            previous anesthesia was also reviewed. The risks                            and benefits of the procedure and the sedation                            options and risks were discussed with the patient.                            All questions were answered, and informed consent                            was obtained. Prior Anticoagulants: The patient has                            taken no previous anticoagulant or antiplatelet                            agents. ASA Grade Assessment: II - A patient with                            mild systemic disease. After reviewing the risks                            and benefits, the patient was deemed in                            satisfactory condition to undergo the procedure.                           After obtaining informed consent, the colonoscope  was passed under direct vision. Throughout the                            procedure, the patient's blood pressure, pulse, and                            oxygen saturations were monitored continuously. The                            Model PCF-H190DL [id number]) scope was introduced                            through the anus and advanced to the the cecum,                            identified by  appendiceal orifice and ileocecal                            valve. The colonoscopy was performed without                            difficulty. The patient tolerated the procedure                            well. The quality of the bowel preparation was                            inadequate. The ileocecal valve, appendiceal                            orifice, and rectum were photographed. Scope In: 11:12:50 AM Scope Out: 11:33:14 AM Scope Withdrawal Time: 0 hours 6 minutes 30 seconds  Total Procedure Duration: 0 hours 20 minutes 24 seconds  Findings:                 The perianal and digital rectal examinations were                            normal.                           Two sessile polyps were found in the transverse                            colon and cecum. The polyps were 4 to 6 mm in size.                            These polyps were removed with a cold snare.                            Resection and retrieval were complete.                           Two sessile polyps were found in the transverse  colon and ascending colon. The polyps were 1 to 3                            mm in size. These polyps were removed with a cold                            biopsy forceps. Resection and retrieval were                            complete.                           Multiple small and large-mouthed diverticula were                            found in the sigmoid colon, descending colon and                            transverse colon. There was evidence of an impacted                            diverticulum.                           Non-bleeding internal hemorrhoids were found during                            retroflexion. The hemorrhoids were small. Complications:            No immediate complications. Estimated Blood Loss:     Estimated blood loss was minimal. Impression:               - Preparation of the colon was inadequate.                           -  Two 4 to 6 mm polyps in the transverse colon and                            in the cecum, removed with a cold snare. Resected                            and retrieved.                           - Two 1 to 3 mm polyps in the transverse colon and                            in the ascending colon, removed with a cold biopsy                            forceps. Resected and retrieved.                           - Moderate diverticulosis in the sigmoid colon, in  the descending colon and in the transverse colon.                            There was evidence of an impacted diverticulum.                           - Non-bleeding internal hemorrhoids. Recommendation:           - Patient has a contact number available for                            emergencies. The signs and symptoms of potential                            delayed complications were discussed with the                            patient. Return to normal activities tomorrow.                            Written discharge instructions were provided to the                            patient.                           - Resume previous diet.                           - Continue present medications.                           - Await pathology results.                           - Repeat colonoscopy at the next available                            appointment because the bowel preparation was poor                            and for surveillance based on pathology results.                           - For future colonoscopy the patient will require                            an extended preparation. If there are any                            questions, please contact the gastroenterologist. Mauri Pole, MD 07/24/2018 11:40:20 AM This report has been signed electronically.

## 2018-07-24 NOTE — Progress Notes (Signed)
A and O x3. Report to RN. Tolerated MAC anesthesia well.

## 2018-07-27 ENCOUNTER — Telehealth: Payer: Self-pay | Admitting: *Deleted

## 2018-07-27 NOTE — Telephone Encounter (Signed)
  Follow up Call-  Call back number 07/24/2018  Post procedure Call Back phone  # [phone]  Permission to leave phone message Yes     Patient questions:  Do you have a fever, pain , or abdominal swelling? No. Pain Score  0 *  Have you tolerated food without any problems? Yes.    Have you been able to return to your normal activities? Yes.    Do you have any questions about your discharge instructions: Diet   No. Medications  No. Follow up visit  No.  Do you have questions or concerns about your Care? No.  Actions: * If pain score is 4 or above: No action needed, pain <4.

## 2018-07-27 NOTE — Telephone Encounter (Signed)
Attempted post procedure follow up call.  No answer.  Left message.  Will attempted to call again this afternoon.

## 2018-08-03 ENCOUNTER — Encounter: Payer: Self-pay | Admitting: Gastroenterology

## 2018-08-04 ENCOUNTER — Telehealth: Payer: Self-pay | Admitting: *Deleted

## 2018-08-04 NOTE — Telephone Encounter (Signed)
Attempted to call patient to schedule repeat colonoscopy. Left message and sent letter for patient to make at her convenience. Sm

## 2018-09-11 ENCOUNTER — Encounter: Payer: BLUE CROSS/BLUE SHIELD | Admitting: Gastroenterology

## 2018-09-28 ENCOUNTER — Telehealth: Payer: Self-pay | Admitting: Gastroenterology

## 2018-09-29 NOTE — Telephone Encounter (Signed)
Spoke with the patient. Recall edited.

## 2018-09-29 NOTE — Telephone Encounter (Signed)
She can have it sooner if no issue from insurance as prep was inadequate.

## 2018-09-29 NOTE — Telephone Encounter (Signed)
Please advise. Thank you

## 2018-11-18 HISTORY — PX: COLONOSCOPY: SHX174

## 2018-11-18 HISTORY — PX: POLYPECTOMY: SHX149

## 2018-11-27 ENCOUNTER — Ambulatory Visit: Payer: BLUE CROSS/BLUE SHIELD | Admitting: Nurse Practitioner

## 2018-11-27 ENCOUNTER — Encounter: Payer: Self-pay | Admitting: Nurse Practitioner

## 2018-11-27 VITALS — BP 102/68 | HR 82 | Temp 99.9°F | Wt 171.0 lb

## 2018-11-27 DIAGNOSIS — J40 Bronchitis, not specified as acute or chronic: Secondary | ICD-10-CM | POA: Diagnosis not present

## 2018-11-27 MED ORDER — AZITHROMYCIN 250 MG PO TABS: ORAL_TABLET | ORAL | 0 refills | Status: DC

## 2018-11-27 NOTE — Progress Notes (Signed)
  Meredith Spence is a 53 y.o. female with the following history as recorded in [employer]:  There are no active problems to display for this patient.   Current Outpatient Medications  Medication Sig Dispense Refill  . levonorgestrel (MIRENA) 20 MCG/24HR IUD 1 each by Intrauterine route once.    . Multiple Vitamin (MULTIVITAMIN) capsule Take 1 capsule by mouth daily.    Marland Kitchen azithromycin (ZITHROMAX) 250 MG tablet Take 2 tablets today then 1 tablet daily until complete. 6 tablet 0   No current facility-administered medications for this visit.     Allergies: Patient has no known allergies.  Past Medical History:  Diagnosis Date  . Allergy     Past Surgical History:  Procedure Laterality Date  . BREAST CYST ASPIRATION Left    neg  . Fibroid Cyst      Family History  Adopted: Yes    Social History   Tobacco Use  . Smoking status: Never Smoker  . Smokeless tobacco: Never Used  Substance Use Topics  . Alcohol use: Yes    Comment: ocass     Subjective:  Ms Gens is here today requesting evaluation of acute complaint of cough/cold symptoms, which first began about 4-5 days ago, feeling progressively worse since onset. Reports: fevers, chills, headaches, ear aches, sinus pain and pressure, sore throat, np cough Denies: abdominal pain, nausea,vomiting, bowel or bladder changes, diarrhea Smoker? No Tried at home: motirn, albuterol, nyquil with minimal relief, also took a few leftover amoxicillin  Recently traveled to [place], [place] over the holidays, her husband had similar symptoms and was admitted for IV abx for pneumonia this week She has a past history of bronchitis    ROS- See HPI  Objective:  Vitals:   11/27/18 1531  BP: 102/68  Pulse: 82  Temp: 99.9 F (37.7 C)  SpO2: 96%  Weight: 171 lb (77.6 kg)    General: Well developed, well nourished, ill appearing Skin : Warm and dry.  Head: Normocephalic and atraumatic  Eyes: Sclera and conjunctiva clear; pupils round and  reactive to light; extraocular movements intact  Ears: External normal; canals clear; tympanic membranes bulging bilaterally, without erythema or effusion  Oropharynx: Mild posterior oropharyngeal erythema, no edema or exudate. No suspicious lesions  Neck: Supple without thyromegaly, adenopathy  Lungs: Respirations unlabored; clear to auscultation bilaterally without wheeze, rales; coarse cough CVS exam: normal rate and regular rhythm.  Extremities: No edema, cyanosis, clubbing  Vessels: Symmetric bilaterally  Neurologic: Alert and oriented; speech intact; face symmetrical; moves all extremities well; CNII-XII intact without focal deficit  Psychiatric: Normal mood and affect.   Assessment:  1. Bronchitis     Plan:   Due to worsening symptoms, household member with pneumonia, and ill appearance will go ahead and treat with zithromax course- medication dosing, side effects discussed Home management, red flags and return precautions including when to seek immediate care discussed and printed on AVS She will f/u for new or worsening symptoms or if symptoms persist after antibiotic cousre  No follow-ups on file.  No orders of the defined types were placed in this encounter.   Requested Prescriptions   Signed Prescriptions Disp Refills  . azithromycin (ZITHROMAX) 250 MG tablet 6 tablet 0    Sig: Take 2 tablets today then 1 tablet daily until complete.

## 2018-11-27 NOTE — Patient Instructions (Addendum)
Please complete antibiotic as instructed.  Please follow up for fevers over 101, if your symptoms get worse, or if your symptoms dont  Improve with the antibiotic.   Rest and stay hydrated. I hope you feel better!!!   Acute Bronchitis, Adult Acute bronchitis is when air tubes (bronchi) in the lungs suddenly get swollen. The condition can make it hard to breathe. It can also cause these symptoms:  A cough.  Coughing up clear, yellow, or green mucus.  Wheezing.  Chest congestion.  Shortness of breath.  A fever.  Body aches.  Chills.  A sore throat. Follow these instructions at home:  Medicines  Take over-the-counter and prescription medicines only as told by your doctor.  If you were prescribed an antibiotic medicine, take it as told by your doctor. Do not stop taking the antibiotic even if you start to feel better. General instructions  Rest.  Drink enough fluids to keep your pee (urine) pale yellow.  Avoid smoking and secondhand smoke. If you smoke and you need help quitting, ask your doctor. Quitting will help your lungs heal faster.  Use an inhaler, cool mist vaporizer, or humidifier as told by your doctor.  Keep all follow-up visits as told by your doctor. This is important. How is this prevented? To lower your risk of getting this condition again:  Wash your hands often with soap and water. If you cannot use soap and water, use hand sanitizer.  Avoid contact with people who have cold symptoms.  Try not to touch your hands to your mouth, nose, or eyes.  Make sure to get the flu shot every year. Contact a doctor if:  Your symptoms do not get better in 2 weeks. Get help right away if:  You cough up blood.  You have chest pain.  You have very bad shortness of breath.  You become dehydrated.  You faint (pass out) or keep feeling like you are going to pass out.  You keep throwing up (vomiting).  You have a very bad headache.  Your fever or  chills gets worse. This information is not intended to replace advice given to you by your health care provider. Make sure you discuss any questions you have with your health care provider. Document Released: 04/22/2008 Document Revised: 06/18/2017 Document Reviewed: 04/24/2016 Elsevier Interactive Patient Education  2019 [employer].

## 2018-11-30 ENCOUNTER — Encounter: Payer: Self-pay | Admitting: Nurse Practitioner

## 2018-12-14 ENCOUNTER — Encounter: Payer: Self-pay | Admitting: Medical

## 2018-12-14 ENCOUNTER — Ambulatory Visit: Payer: BLUE CROSS/BLUE SHIELD | Admitting: Medical

## 2018-12-14 VITALS — BP 130/82 | HR 78 | Temp 98.4°F | Resp 16 | Wt 174.6 lb

## 2018-12-14 DIAGNOSIS — R05 Cough: Secondary | ICD-10-CM

## 2018-12-14 DIAGNOSIS — R059 Cough, unspecified: Secondary | ICD-10-CM

## 2018-12-14 MED ORDER — ALBUTEROL SULFATE HFA 108 (90 BASE) MCG/ACT IN AERS: 2.0000 | INHALATION_SPRAY | Freq: Four times a day (QID) | RESPIRATORY_TRACT | 0 refills | Status: DC | PRN

## 2018-12-14 MED ORDER — BENZONATATE 100 MG PO CAPS: 100.0000 mg | ORAL_CAPSULE | Freq: Three times a day (TID) | ORAL | 0 refills | Status: DC | PRN

## 2018-12-14 NOTE — Progress Notes (Signed)
Subjective:    Patient ID: Meredith Spence, female    DOB: 1966/07/07, 53 y.o.   MRN: [id number]  HPI 53 yo female in non acute distress. Comes in today with complaiints of  Cough. Seen by primary doctor and diagnosed with Bronchitis on 11/27/2018. Treated with Z-pak  Coninues with dry cough  Husband with Pneumonia jsut before she got sick with Bronchitis. No fever or chills. Sometimes gets "winded with talking". Blood pressure 130/82, pulse 78, temperature 98.4 F (36.9 C), temperature source Tympanic, resp. rate 16, weight 174 lb 9.6 oz (79.2 kg), SpO2 98 %.  No Known Allergies  Review of Systems  Constitutional: Positive for appetite change (hard to know due to her being upset about father). Negative for chills, fatigue and fever.  HENT: Positive for congestion, ear pain, postnasal drip, rhinorrhea, sneezing and sore throat (itchy). Negative for ear discharge, sinus pressure and sinus pain.   Eyes: Negative for discharge and itching.  Respiratory: Positive for cough and shortness of breath (something with talking). Negative for chest tightness and wheezing.   Cardiovascular: Negative for chest pain.  Gastrointestinal: Negative for abdominal pain.  Endocrine: Negative for polydipsia, polyphagia and polyuria.  Genitourinary: Negative for dysuria.  Musculoskeletal: Negative for myalgias.  Skin: Negative for rash.  Allergic/Immunologic: Positive for environmental allergies. Negative for food allergies.  Neurological: Negative for dizziness, syncope, light-headedness and headaches.  Hematological: Negative for adenopathy.  Psychiatric/Behavioral: Negative for behavioral problems, confusion, self-injury and suicidal ideas.   Father is dying and is traveling to [place]. H has colon cancer. [place] lost a brother this year to stomach cancer.    Objective:   Physical Exam Vitals signs and nursing note reviewed.  Constitutional:      Appearance: Normal appearance. She is normal weight.   HENT:     Head: Normocephalic and atraumatic.     Right Ear: Ear canal and external ear normal.     Left Ear: Ear canal and external ear normal.     Nose: Nose normal. No congestion or rhinorrhea.     Mouth/Throat:     Mouth: Mucous membranes are dry.     Pharynx: Oropharynx is clear.  Eyes:     Extraocular Movements: Extraocular movements intact.     Conjunctiva/sclera: Conjunctivae normal.     Pupils: Pupils are equal, round, and reactive to light.  Neck:     Musculoskeletal: Normal range of motion and neck supple.  Cardiovascular:     Rate and Rhythm: Normal rate and regular rhythm.     Heart sounds: Normal heart sounds.  Pulmonary:     Effort: Pulmonary effort is normal. No respiratory distress.     Breath sounds: Normal breath sounds. No stridor. No wheezing, rhonchi or rales.  Musculoskeletal: Normal range of motion.  Skin:    General: Skin is warm and dry.  Neurological:     General: No focal deficit present.     Mental Status: She is alert and oriented to person, place, and time.  Psychiatric:        Mood and Affect: Mood normal.        Behavior: Behavior normal.        Thought Content: Thought content normal.        Judgment: Judgment normal.   cobble stoning of posterior pharynx   cough in room , coughing on deep inpiration. Assessment & Plan:  Cough s/p Bronchitis which has resolved. Eustachian tube dysfunction Bilaterally, Use OTC Claritin and Flonase ad directed on the  package. Meds ordered this encounter  Medications  . albuterol (PROVENTIL HFA;VENTOLIN HFA) 108 (90 Base) MCG/ACT inhaler    Sig: Inhale 2 puffs into the lungs every 6 (six) hours as needed for wheezing or shortness of breath.    Dispense:  1 Inhaler    Refill:  0  . benzonatate (TESSALON PERLES) 100 MG capsule    Sig: Take 1 capsule (100 mg total) by mouth 3 (three) times daily as needed.    Dispense:  30 capsule    Refill:  0  Seek out medicat care if fever 101.5 or higher,  Sputum  changes to yellow or green, worsening shortness of breath or chest pain. Patient verbalizes understanding and has no questions at discharge.

## 2018-12-14 NOTE — Patient Instructions (Signed)

## 2018-12-31 ENCOUNTER — Encounter: Payer: Self-pay | Admitting: Gastroenterology

## 2019-01-08 ENCOUNTER — Ambulatory Visit (AMBULATORY_SURGERY_CENTER): Payer: Self-pay

## 2019-01-08 ENCOUNTER — Encounter: Payer: Self-pay | Admitting: Gastroenterology

## 2019-01-08 VITALS — Ht 66.0 in | Wt 174.0 lb

## 2019-01-08 DIAGNOSIS — Z86010 Personal history of colonic polyps: Secondary | ICD-10-CM

## 2019-01-08 MED ORDER — NA SULFATE-K SULFATE-MG SULF 17.5-3.13-1.6 GM/177ML PO SOLN: 1.0000 | Freq: Once | ORAL | 0 refills | Status: AC

## 2019-01-08 NOTE — Progress Notes (Signed)
No egg or soy allergy known to patient  No issues with past sedation with any surgeries  or procedures, no intubation problems  No diet pills per patient No home 02 use per patient  No blood thinners per patient  Pt denies issues with chronic constipation  No A fib or A flutter  EMMI video sent to pt's e mail . Pt declined

## 2019-01-22 ENCOUNTER — Other Ambulatory Visit: Payer: Self-pay

## 2019-01-22 ENCOUNTER — Ambulatory Visit (AMBULATORY_SURGERY_CENTER): Payer: BLUE CROSS/BLUE SHIELD | Admitting: Gastroenterology

## 2019-01-22 ENCOUNTER — Encounter: Payer: Self-pay | Admitting: Gastroenterology

## 2019-01-22 VITALS — BP 116/73 | HR 64 | Temp 97.5°F | Resp 14 | Ht 66.0 in | Wt 174.0 lb

## 2019-01-22 DIAGNOSIS — Z860100 Personal history of colon polyps, unspecified: Secondary | ICD-10-CM

## 2019-01-22 DIAGNOSIS — Z1211 Encounter for screening for malignant neoplasm of colon: Secondary | ICD-10-CM

## 2019-01-22 DIAGNOSIS — D122 Benign neoplasm of ascending colon: Secondary | ICD-10-CM | POA: Diagnosis not present

## 2019-01-22 DIAGNOSIS — D120 Benign neoplasm of cecum: Secondary | ICD-10-CM

## 2019-01-22 DIAGNOSIS — D123 Benign neoplasm of transverse colon: Secondary | ICD-10-CM | POA: Diagnosis not present

## 2019-01-22 DIAGNOSIS — D125 Benign neoplasm of sigmoid colon: Secondary | ICD-10-CM | POA: Diagnosis not present

## 2019-01-22 MED ORDER — SODIUM CHLORIDE 0.9 % IV SOLN: 500.0000 mL | Freq: Once | INTRAVENOUS | Status: DC

## 2019-01-22 NOTE — Op Note (Addendum)
[facility] Patient Name: Meredith Spence Procedure Date: 01/22/2019 1:49 PM MRN: [id number] Endoscopist: Mauri Pole , MD Age: 53 Referring MD:  Date of Birth: 1965/12/09 Gender: Female Account #: [id number] Procedure:                Colonoscopy Indications:              High risk colon cancer surveillance: Personal                            history of multiple (3 or more) adenomas,                            inadequate bowel prep on last colonoscopy Medicines:                Monitored Anesthesia Care Procedure:                Pre-Anesthesia Assessment:                           - Prior to the procedure, a History and Physical                            was performed, and patient medications and                            allergies were reviewed. The patient's tolerance of                            previous anesthesia was also reviewed. The risks                            and benefits of the procedure and the sedation                            options and risks were discussed with the patient.                            All questions were answered, and informed consent                            was obtained. Prior Anticoagulants: The patient has                            taken no previous anticoagulant or antiplatelet                            agents. ASA Grade Assessment: II - A patient with                            mild systemic disease. After reviewing the risks                            and benefits, the patient was deemed in  satisfactory condition to undergo the procedure.                           After obtaining informed consent, the colonoscope                            was passed under direct vision. Throughout the                            procedure, the patient's blood pressure, pulse, and                            oxygen saturations were monitored continuously. The                            Colonoscope was introduced  through the anus and                            advanced to the the terminal ileum, with                            identification of the appendiceal orifice and IC                            valve. The colonoscopy was performed without                            difficulty. The patient tolerated the procedure                            well. The quality of the bowel preparation was                            good. The ileocecal valve, appendiceal orifice, and                            rectum were photographed. Scope In: 1:55:37 PM Scope Out: 2:25:50 PM Scope Withdrawal Time: 0 hours 21 minutes 24 seconds  Total Procedure Duration: 0 hours 30 minutes 13 seconds  Findings:                 The perianal and digital rectal examinations were                            normal.                           Four sessile polyps were found in the ascending                            colon and cecum. The polyps were 1 to 2 mm in size.                            These polyps were removed with a cold biopsy  forceps. Resection and retrieval were complete.                           Four sessile polyps were found in the sigmoid colon                            and transverse colon. The polyps were 5 to 12 mm in                            size. These polyps were removed with a cold snare.                            Resection and retrieval were complete.                           Scattered small and large-mouthed diverticula were                            found in the sigmoid colon and descending colon.                           Non-bleeding internal hemorrhoids were found during                            retroflexion. The hemorrhoids were small. Complications:            No immediate complications. Estimated Blood Loss:     Estimated blood loss was minimal. Impression:               - Four 1 to 2 mm polyps in the ascending colon and                            in the cecum,  removed with a cold biopsy forceps.                            Resected and retrieved.                           - Four 5 to 12 mm polyps in the sigmoid colon and                            in the transverse colon, removed with a cold snare.                            Resected and retrieved.                           - Mild diverticulosis in the sigmoid colon and in                            the descending colon.                           - Non-bleeding internal hemorrhoids. Recommendation:           -  Patient has a contact number available for                            emergencies. The signs and symptoms of potential                            delayed complications were discussed with the                            patient. Return to normal activities tomorrow.                            Written discharge instructions were provided to the                            patient.                           - Resume previous diet.                           - Continue present medications.                           - Await pathology results.                           - Repeat colonoscopy in 3 years for surveillance                            based on pathology results. Mauri Pole, MD 01/22/2019 2:34:23 PM This report has been signed electronically.

## 2019-01-22 NOTE — Progress Notes (Signed)
Report to PACU, RN, vss, BBS= Clear.  

## 2019-01-22 NOTE — Progress Notes (Signed)
Called to room to assist during endoscopic procedure.  Patient ID and intended procedure confirmed with present staff. Received instructions for my participation in the procedure from the performing physician.  

## 2019-01-22 NOTE — Patient Instructions (Signed)
Handout on polyps, hemorrhoids and diverticulosis   YOU HAD AN ENDOSCOPIC PROCEDURE TODAY AT [facility]:   Refer to the procedure report that was given to you for any specific questions about what was found during the examination.  If the procedure report does not answer your questions, please call your gastroenterologist to clarify.  If you requested that your care partner not be given the details of your procedure findings, then the procedure report has been included in a sealed envelope for you to review at your convenience later.  YOU SHOULD EXPECT: Some feelings of bloating in the abdomen. Passage of more gas than usual.  Walking can help get rid of the air that was put into your GI tract during the procedure and reduce the bloating. If you had a lower endoscopy (such as a colonoscopy or flexible sigmoidoscopy) you may notice spotting of blood in your stool or on the toilet paper. If you underwent a bowel prep for your procedure, you may not have a normal bowel movement for a few days.  Please Note:  You might notice some irritation and congestion in your nose or some drainage.  This is from the oxygen used during your procedure.  There is no need for concern and it should clear up in a day or so.  SYMPTOMS TO REPORT IMMEDIATELY:   Following lower endoscopy (colonoscopy or flexible sigmoidoscopy):  Excessive amounts of blood in the stool  Significant tenderness or worsening of abdominal pains  Swelling of the abdomen that is new, acute  Fever of 100F or higher   For urgent or emergent issues, a gastroenterologist can be reached at any hour by calling [phone].   DIET:  We do recommend a small meal at first, but then you may proceed to your regular diet.  Drink plenty of fluids but you should avoid alcoholic beverages for 24 hours.  ACTIVITY:  You should plan to take it easy for the rest of today and you should NOT DRIVE or use heavy machinery until tomorrow  (because of the sedation medicines used during the test).    FOLLOW UP: Our staff will call the number listed on your records the next business day following your procedure to check on you and address any questions or concerns that you may have regarding the information given to you following your procedure. If we do not reach you, we will leave a message.  However, if you are feeling well and you are not experiencing any problems, there is no need to return our call.  We will assume that you have returned to your regular daily activities without incident.  If any biopsies were taken you will be contacted by phone or by letter within the next 1-3 weeks.  Please call us at [phone] if you have not heard about the biopsies in 3 weeks.    SIGNATURES/CONFIDENTIALITY: You and/or your care partner have signed paperwork which will be entered into your electronic medical record.  These signatures attest to the fact that that the information above on your After Visit Summary has been reviewed and is understood.  Full responsibility of the confidentiality of this discharge information lies with you and/or your care-partner.

## 2019-01-25 ENCOUNTER — Telehealth: Payer: Self-pay

## 2019-01-25 NOTE — Telephone Encounter (Signed)
  Follow up Call-  Call back number 01/22/2019 07/24/2018  Post procedure Call Back phone  # [phone]  Permission to leave phone message Yes Yes  Some recent data might be hidden     Patient questions:  Do you have a fever, pain , or abdominal swelling? No. Pain Score  0 *  Have you tolerated food without any problems? Yes.    Have you been able to return to your normal activities? Yes.    Do you have any questions about your discharge instructions: Diet   No. Medications  No. Follow up visit  No.  Do you have questions or concerns about your Care? No.  Actions: * If pain score is 4 or above: No action needed, pain <4.

## 2019-02-01 ENCOUNTER — Encounter: Payer: Self-pay | Admitting: Gastroenterology

## 2019-07-05 ENCOUNTER — Encounter: Payer: Self-pay | Admitting: Certified Nurse Midwife

## 2019-07-05 ENCOUNTER — Ambulatory Visit (INDEPENDENT_AMBULATORY_CARE_PROVIDER_SITE_OTHER): Payer: BC Managed Care – PPO | Admitting: Certified Nurse Midwife

## 2019-07-05 VITALS — BP 94/71 | HR 77 | Ht 66.0 in | Wt 170.1 lb

## 2019-07-05 DIAGNOSIS — R030 Elevated blood-pressure reading, without diagnosis of hypertension: Secondary | ICD-10-CM | POA: Diagnosis not present

## 2019-07-05 DIAGNOSIS — Z0000 Encounter for general adult medical examination without abnormal findings: Secondary | ICD-10-CM

## 2019-07-05 DIAGNOSIS — Z1239 Encounter for other screening for malignant neoplasm of breast: Secondary | ICD-10-CM

## 2019-07-05 NOTE — Progress Notes (Addendum)
GYNECOLOGY ANNUAL PREVENTATIVE CARE ENCOUNTER NOTE  History:     Meredith Spence is a 53 y.o. G74P0010 female here for a routine annual gynecologic exam.  Current complaints: breast tenderness with her cycle that she did not used to have..   Denies abnormal vaginal bleeding, discharge, pelvic pain, problems with intercourse or other gynecologic concerns.    Gynecologic History Patient's last menstrual period was 06/25/2019 (approximate). Contraception: IUD 2016  Last Pap: 05/18/15 Results were: normal with negative HPV Last mammogram: 06/11/2018. Results were: normal  Obstetric History OB History  Gravida Para Term Preterm AB Living  1       1    SAB TAB Ectopic Multiple Live Births               # Outcome Date GA Lbr Len/2nd Weight Sex Delivery Anes PTL Lv  1 AB 1995            Past Medical History:  Diagnosis Date  . Allergy     Past Surgical History:  Procedure Laterality Date  . BREAST CYST ASPIRATION Left    neg  . COLONOSCOPY    . Fibroid Cyst    . POLYPECTOMY      Current Outpatient Medications on File Prior to Visit  Medication Sig Dispense Refill  . levonorgestrel (MIRENA) 20 MCG/24HR IUD 1 each by Intrauterine route once.    . Multiple Vitamin (MULTIVITAMIN) capsule Take 1 capsule by mouth daily.    Marland Kitchen albuterol (PROVENTIL HFA;VENTOLIN HFA) 108 (90 Base) MCG/ACT inhaler Inhale 2 puffs into the lungs every 6 (six) hours as needed for wheezing or shortness of breath. (Patient not taking: Reported on 01/22/2019) 1 Inhaler 0  . dextromethorphan-guaiFENesin (MUCINEX DM) 30-600 MG 12hr tablet Take 1 tablet by mouth 2 (two) times daily.     No current facility-administered medications on file prior to visit.     No Known Allergies  Social History:  reports that she has never smoked. She has never used smokeless tobacco. She reports current alcohol use of about 1.0 standard drinks of alcohol per week. She reports previous drug use.  Family History  Adopted: Yes   Family history unknown: Yes    The following portions of the patient's history were reviewed and updated as appropriate: allergies, current medications, past family history, past medical history, past social history, past surgical history and problem list . Lost her brother and father last yr to cancer, she had weight loss and gian due to stress. She declines genetic testing. Irregular exercise.   Review of Systems Pertinent items noted in HPI and remainder of comprehensive ROS otherwise negative.  Physical Exam:  BP 94/71   Pulse 77   Ht 5\' 6"  (1.676 m)   Wt 170 lb 2 oz (77.2 kg)   LMP 06/25/2019 (Approximate)   BMI 27.46 kg/m  CONSTITUTIONAL: Well-developed, well-nourished female in no acute distress.  HENT:  Normocephalic, atraumatic, External right and left ear normal. Oropharynx is clear and moist EYES: Conjunctivae and EOM are normal. Pupils are equal, round, and reactive to light. No scleral icterus.  NECK: Normal range of motion, supple, no masses.  Normal thyroid.  SKIN: Skin is warm and dry. No rash noted. Not diaphoretic. No erythema. No pallor. MUSCULOSKELETAL: Normal range of motion. No tenderness.  No cyanosis, clubbing, or edema.  2+ distal pulses. NEUROLOGIC: Alert and oriented to person, place, and time. Normal reflexes, muscle tone coordination. No cranial nerve deficit noted. PSYCHIATRIC: Normal mood and affect. Normal behavior.  Normal judgment and thought content. CARDIOVASCULAR: Normal heart rate noted, regular rhythm RESPIRATORY: Clear to auscultation bilaterally. Effort and breath sounds normal, no problems with respiration noted. BREASTS: Symmetric in size. No masses, skin changes, nipple drainage, or lymphadenopathy. Fibrocystic dense breast tissue, increase tenderness with cycle.  ABDOMEN: Soft, normal bowel sounds, no distention noted.  No tenderness, rebound or guarding.  PELVIC: Normal appearing external genitalia; normal appearing vaginal mucosa and  cervix.  No abnormal discharge noted.  Pap smear not due until 2021.IUD strings present.   Normal uterine size, no other palpable masses, no uterine or adnexal tenderness.   Assessment and Plan:   Annual Well Women GYN exam  Pap smear not indicated until 2021 Colonoscopy: 11/2018 10 polyps removed, repeat due 2023 Labs  Lipid panel done 05/18/2018, TSH done 05/18/18. Due: Hemoglobin A 1 C-ordered Mammogram ordred Encouraged good supportive bra, decrease in caffeine and fat intake.   Routine preventative health maintenance measures emphasized. Please refer to After Visit Summary for other counseling recommendations.     Philip Aspen, CNM

## 2019-07-05 NOTE — Patient Instructions (Signed)
Preventive Care 89-52 Years Old, Female Preventive care refers to visits with your health care provider and lifestyle choices that can promote health and wellness. This includes:  A yearly physical exam. This may also be called an annual well check.  Regular dental visits and eye exams.  Immunizations.  Screening for certain conditions.  Healthy lifestyle choices, such as eating a healthy diet, getting regular exercise, not using drugs or products that contain nicotine and tobacco, and limiting alcohol use. What can I expect for my preventive care visit? Physical exam Your health care provider will check your:  Height and weight. This may be used to calculate body mass index (BMI), which tells if you are at a healthy weight.  Heart rate and blood pressure.  Skin for abnormal spots. Counseling Your health care provider may ask you questions about your:  Alcohol, tobacco, and drug use.  Emotional well-being.  Home and relationship well-being.  Sexual activity.  Eating habits.  Work and work Statistician.  Method of birth control.  Menstrual cycle.  Pregnancy history. What immunizations do I need?  Influenza (flu) vaccine  This is recommended every year. Tetanus, diphtheria, and pertussis (Tdap) vaccine  You may need a Td booster every 10 years. Varicella (chickenpox) vaccine  You may need this if you have not been vaccinated. Zoster (shingles) vaccine  You may need this after age 70. Measles, mumps, and rubella (MMR) vaccine  You may need at least one dose of MMR if you were born in 1957 or later. You may also need a second dose. Pneumococcal conjugate (PCV13) vaccine  You may need this if you have certain conditions and were not previously vaccinated. Pneumococcal polysaccharide (PPSV23) vaccine  You may need one or two doses if you smoke cigarettes or if you have certain conditions. Meningococcal conjugate (MenACWY) vaccine  You may need this if you  have certain conditions. Hepatitis A vaccine  You may need this if you have certain conditions or if you travel or work in places where you may be exposed to hepatitis A. Hepatitis B vaccine  You may need this if you have certain conditions or if you travel or work in places where you may be exposed to hepatitis B. Haemophilus influenzae type b (Hib) vaccine  You may need this if you have certain conditions. Human papillomavirus (HPV) vaccine  If recommended by your health care provider, you may need three doses over 6 months. You may receive vaccines as individual doses or as more than one vaccine together in one shot (combination vaccines). Talk with your health care provider about the risks and benefits of combination vaccines. What tests do I need? Blood tests  Lipid and cholesterol levels. These may be checked every 5 years, or more frequently if you are over 33 years old.  Hepatitis C test.  Hepatitis B test. Screening  Lung cancer screening. You may have this screening every year starting at age 68 if you have a 30-pack-year history of smoking and currently smoke or have quit within the past 15 years.  Colorectal cancer screening. All adults should have this screening starting at age 81 and continuing until age 66. Your health care provider may recommend screening at age 58 if you are at increased risk. You will have tests every 1-10 years, depending on your results and the type of screening test.  Diabetes screening. This is done by checking your blood sugar (glucose) after you have not eaten for a while (fasting). You may have this  done every 1-3 years.  Mammogram. This may be done every 1-2 years. Talk with your health care provider about when you should start having regular mammograms. This may depend on whether you have a family history of breast cancer.  BRCA-related cancer screening. This may be done if you have a family history of breast, ovarian, tubal, or peritoneal  cancers.  Pelvic exam and Pap test. This may be done every 3 years starting at age 70. Starting at age 33, this may be done every 5 years if you have a Pap test in combination with an HPV test. Other tests  Sexually transmitted disease (STD) testing.  Bone density scan. This is done to screen for osteoporosis. You may have this scan if you are at high risk for osteoporosis. Follow these instructions at home: Eating and drinking  Eat a diet that includes fresh fruits and vegetables, whole grains, lean protein, and low-fat dairy.  Take vitamin and mineral supplements as recommended by your health care provider.  Do not drink alcohol if: ? Your health care provider tells you not to drink. ? You are pregnant, may be pregnant, or are planning to become pregnant.  If you drink alcohol: ? Limit how much you have to 0-1 drink a day. ? Be aware of how much alcohol is in your drink. In the [place] one drink equals one 12 oz bottle of beer (355 mL), one 5 oz glass of wine (148 mL), or one 1 oz glass of hard liquor (44 mL). Lifestyle  Take daily care of your teeth and gums.  Stay active. Exercise for at least 30 minutes on 5 or more days each week.  Do not use any products that contain nicotine or tobacco, such as cigarettes, e-cigarettes, and chewing tobacco. If you need help quitting, ask your health care provider.  If you are sexually active, practice safe sex. Use a condom or other form of birth control (contraception) in order to prevent pregnancy and STIs (sexually transmitted infections).  If told by your health care provider, take low-dose aspirin daily starting at age 62. What's next?  Visit your health care provider once a year for a well check visit.  Ask your health care provider how often you should have your eyes and teeth checked.  Stay up to date on all vaccines. This information is not intended to replace advice given to you by your health care provider. Make sure you  discuss any questions you have with your health care provider. Document Released: 12/01/2015 Document Revised: 07/16/2018 Document Reviewed: 07/16/2018 Elsevier Patient Education  2020 [employer].

## 2019-07-12 ENCOUNTER — Other Ambulatory Visit: Payer: Self-pay | Admitting: Certified Nurse Midwife

## 2019-07-14 ENCOUNTER — Telehealth: Payer: Self-pay | Admitting: Certified Nurse Midwife

## 2019-07-14 ENCOUNTER — Telehealth: Payer: Self-pay

## 2019-07-14 NOTE — Telephone Encounter (Signed)
Mychart message sent.

## 2019-07-14 NOTE — Telephone Encounter (Signed)
The patient called and stated that she wants to know if she is able to get her flu shot when she comes in for her lab appointment this Friday. Please advise.

## 2019-07-16 ENCOUNTER — Other Ambulatory Visit: Payer: BC Managed Care – PPO

## 2019-07-16 ENCOUNTER — Other Ambulatory Visit: Payer: Self-pay

## 2019-07-16 DIAGNOSIS — Z0000 Encounter for general adult medical examination without abnormal findings: Secondary | ICD-10-CM

## 2019-07-17 LAB — HEMOGLOBIN A1C
Est. average glucose Bld gHb Est-mCnc: 97 mg/dL
Hgb A1c MFr Bld: 5 % (ref 4.8–5.6)

## 2019-08-18 ENCOUNTER — Other Ambulatory Visit: Payer: Self-pay

## 2019-08-18 ENCOUNTER — Ambulatory Visit: Payer: Self-pay

## 2019-08-18 DIAGNOSIS — Z23 Encounter for immunization: Secondary | ICD-10-CM

## 2019-09-29 ENCOUNTER — Ambulatory Visit
Admission: RE | Admit: 2019-09-29 | Discharge: 2019-09-29 | Disposition: A | Discharge status: Home / Self Care | Payer: BC Managed Care – PPO | Source: Ambulatory Visit | Attending: Certified Nurse Midwife | Admitting: Certified Nurse Midwife

## 2019-09-29 DIAGNOSIS — Z1231 Encounter for screening mammogram for malignant neoplasm of breast: Secondary | ICD-10-CM | POA: Diagnosis not present

## 2019-09-29 DIAGNOSIS — Z1239 Encounter for other screening for malignant neoplasm of breast: Secondary | ICD-10-CM | POA: Diagnosis present

## 2019-10-11 DIAGNOSIS — M25552 Pain in left hip: Secondary | ICD-10-CM | POA: Diagnosis not present

## 2020-04-26 DIAGNOSIS — M7731 Calcaneal spur, right foot: Secondary | ICD-10-CM | POA: Diagnosis not present

## 2020-04-26 DIAGNOSIS — M722 Plantar fascial fibromatosis: Secondary | ICD-10-CM | POA: Diagnosis not present

## 2020-05-03 DIAGNOSIS — M71571 Other bursitis, not elsewhere classified, right ankle and foot: Secondary | ICD-10-CM | POA: Diagnosis not present

## 2020-05-03 DIAGNOSIS — M722 Plantar fascial fibromatosis: Secondary | ICD-10-CM | POA: Diagnosis not present

## 2020-07-07 ENCOUNTER — Encounter: Payer: BC Managed Care – PPO | Admitting: Certified Nurse Midwife

## 2020-07-11 ENCOUNTER — Encounter: Payer: Self-pay | Admitting: Certified Nurse Midwife

## 2020-07-11 ENCOUNTER — Other Ambulatory Visit: Payer: Self-pay

## 2020-07-11 ENCOUNTER — Ambulatory Visit (INDEPENDENT_AMBULATORY_CARE_PROVIDER_SITE_OTHER): Payer: BC Managed Care – PPO | Admitting: Certified Nurse Midwife

## 2020-07-11 ENCOUNTER — Other Ambulatory Visit (HOSPITAL_COMMUNITY)
Admission: RE | Admit: 2020-07-11 | Discharge: 2020-07-11 | Disposition: A | Discharge status: Home / Self Care | Payer: BC Managed Care – PPO | Source: Ambulatory Visit | Attending: Certified Nurse Midwife | Admitting: Certified Nurse Midwife

## 2020-07-11 VITALS — BP 126/66 | HR 69 | Ht 66.0 in | Wt 169.3 lb

## 2020-07-11 DIAGNOSIS — Z01419 Encounter for gynecological examination (general) (routine) without abnormal findings: Secondary | ICD-10-CM | POA: Insufficient documentation

## 2020-07-11 DIAGNOSIS — Z1159 Encounter for screening for other viral diseases: Secondary | ICD-10-CM

## 2020-07-11 DIAGNOSIS — Z124 Encounter for screening for malignant neoplasm of cervix: Secondary | ICD-10-CM | POA: Diagnosis not present

## 2020-07-11 DIAGNOSIS — Z1231 Encounter for screening mammogram for malignant neoplasm of breast: Secondary | ICD-10-CM | POA: Diagnosis not present

## 2020-07-11 DIAGNOSIS — Z114 Encounter for screening for human immunodeficiency virus [HIV]: Secondary | ICD-10-CM

## 2020-07-11 NOTE — Patient Instructions (Signed)

## 2020-07-11 NOTE — Progress Notes (Addendum)
GYNECOLOGY ANNUAL PREVENTATIVE CARE ENCOUNTER NOTE  History:     Meredith Spence is a 54 y.o. G10P0010 female here for a routine annual gynecologic exam.  Current complaints: none.   Denies abnormal vaginal bleeding, discharge, pelvic pain, problems with intercourse or other gynecologic concerns.     Relationship: married  Living: with spouse Work: currently unemployed (looking for new job) Exercise: 3-4 times wk Smoke/ alcohol/ drug use: Denies    Gynecologic History No LMP recorded. (Menstrual status: IUD). Contraception: IUD, 2016 Last Pap: 05/18/2015 Results were: normal with negative HPV Last mammogram: 11/12/.20 Results were: normal  Obstetric History OB History  Gravida Para Term Preterm AB Living  1       1    SAB TAB Ectopic Multiple Live Births               # Outcome Date GA Lbr Len/2nd Weight Sex Delivery Anes PTL Lv  1 AB 1995            Past Medical History:  Diagnosis Date  . Allergy   . Bone spur of right foot   . Plantar fasciitis of right foot     Past Surgical History:  Procedure Laterality Date  . BREAST CYST ASPIRATION Left    neg  . COLONOSCOPY    . Fibroid Cyst    . POLYPECTOMY      Current Outpatient Medications on File Prior to Visit  Medication Sig Dispense Refill  . levonorgestrel (MIRENA) 20 MCG/24HR IUD 1 each by Intrauterine route once.    . meloxicam (MOBIC) 7.5 MG tablet Take 7.5 mg by mouth daily.    . Multiple Vitamin (MULTIVITAMIN) capsule Take 1 capsule by mouth daily.    Marland Kitchen albuterol (PROVENTIL HFA;VENTOLIN HFA) 108 (90 Base) MCG/ACT inhaler Inhale 2 puffs into the lungs every 6 (six) hours as needed for wheezing or shortness of breath. (Patient not taking: Reported on 01/22/2019) 1 Inhaler 0  . dextromethorphan-guaiFENesin (MUCINEX DM) 30-600 MG 12hr tablet Take 1 tablet by mouth 2 (two) times daily.     No current facility-administered medications on file prior to visit.    No Known Allergies  Social History:  reports  that she has never smoked. She has never used smokeless tobacco. She reports current alcohol use of about 1.0 standard drink of alcohol per week. She reports previous drug use.  Family History  Adopted: Yes  Problem Relation Age of Onset  . Breast cancer Neg Hx     The following portions of the patient's history were reviewed and updated as appropriate: allergies, current medications, past family history, past medical history, past social history, past surgical history and problem list.  Review of Systems Pertinent items noted in HPI and remainder of comprehensive ROS otherwise negative.  Physical Exam:  BP 126/66   Pulse 69   Ht 5\' 6"  (1.676 m)   Wt 169 lb 5 oz (76.8 kg)   BMI 27.33 kg/m  CONSTITUTIONAL: Well-developed, well-nourished female in no acute distress.  HENT:  Normocephalic, atraumatic, External right and left ear normal. Oropharynx is clear and moist EYES: Conjunctivae and EOM are normal. Pupils are equal, round, and reactive to light. No scleral icterus.  NECK: Normal range of motion, supple, no masses.  Normal thyroid.  SKIN: Skin is warm and dry. No rash noted. Not diaphoretic. No erythema. No pallor. MUSCULOSKELETAL: Normal range of motion. No tenderness.  No cyanosis, clubbing, or edema.  2+ distal pulses. NEUROLOGIC: Alert and oriented  to person, place, and time. Normal reflexes, muscle tone coordination.  PSYCHIATRIC: Normal mood and affect. Normal behavior. Normal judgment and thought content. CARDIOVASCULAR: Normal heart rate noted, regular rhythm RESPIRATORY: Clear to auscultation bilaterally. Effort and breath sounds normal, no problems with respiration noted. BREASTS: Symmetric in size. No masses, tenderness, skin changes, nipple drainage, or lymphadenopathy bilaterally.  ABDOMEN: Soft, no distention noted.  No tenderness, rebound or guarding.  PELVIC: Normal appearing external genitalia and urethral meatus; normal appearing vaginal mucosa and cervix.  No  abnormal discharge noted.  Pap smear obtained.  Normal uterine size, no other palpable masses, no uterine or adnexal tenderness.   Assessment and Plan:    1. Women's annual routine gynecological examination Will follow up results of pap smear and manage accordingly. Mammogram scheduled Colonoscopy: 11/2018 Labs: ordered Hep C/HIV-pt will check to see if covered by insurance Refill: none Referral: none Discussed extended use of IUD given that she is 54 yrs old that she could keep the IUD in for 1-2 yrs more given that she is perimenopausal. She would like to keep it in . Routine preventative health maintenance measures emphasized. Please refer to After Visit Summary for other counseling recommendations.     Philip Aspen, CNM

## 2020-07-19 LAB — CYTOLOGY - PAP
Comment: NEGATIVE
Diagnosis: NEGATIVE
Diagnosis: REACTIVE
High risk HPV: NEGATIVE

## 2020-08-08 ENCOUNTER — Other Ambulatory Visit: Payer: BC Managed Care – PPO

## 2020-11-18 HISTORY — PX: KNEE ARTHROSCOPY: SUR90

## 2020-12-03 ENCOUNTER — Ambulatory Visit (HOSPITAL_COMMUNITY): Payer: Self-pay

## 2020-12-04 ENCOUNTER — Ambulatory Visit (HOSPITAL_COMMUNITY): Payer: Self-pay

## 2020-12-12 ENCOUNTER — Other Ambulatory Visit: Payer: Self-pay | Admitting: Certified Nurse Midwife

## 2020-12-12 DIAGNOSIS — Z1231 Encounter for screening mammogram for malignant neoplasm of breast: Secondary | ICD-10-CM

## 2021-01-03 ENCOUNTER — Inpatient Hospital Stay: Admission: RE | Admit: 2021-01-03 | Payer: BC Managed Care – PPO | Source: Ambulatory Visit

## 2021-01-03 DIAGNOSIS — Z1231 Encounter for screening mammogram for malignant neoplasm of breast: Secondary | ICD-10-CM

## 2021-01-07 IMAGING — MG DIGITAL SCREENING BILAT W/ TOMO W/ CAD
8 series · 8 of 24 positions shown · non-contrast
Comparison: Previous exam(s).

CLINICAL DATA: Screening.

EXAM:
DIGITAL SCREENING BILATERAL MAMMOGRAM WITH TOMO AND CAD

[R MLO synth-2D]
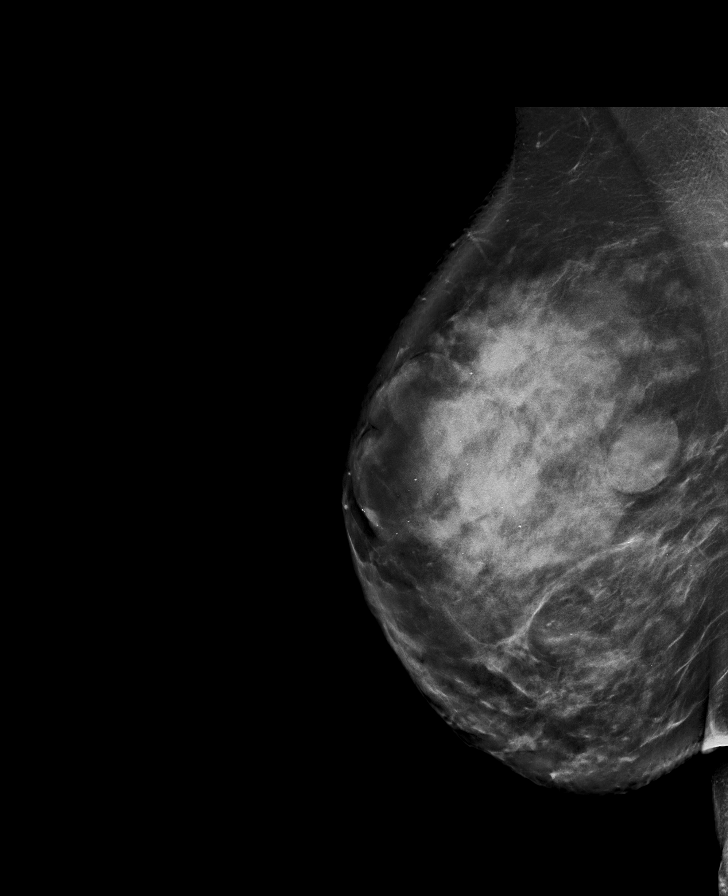

[R CC synth-2D]
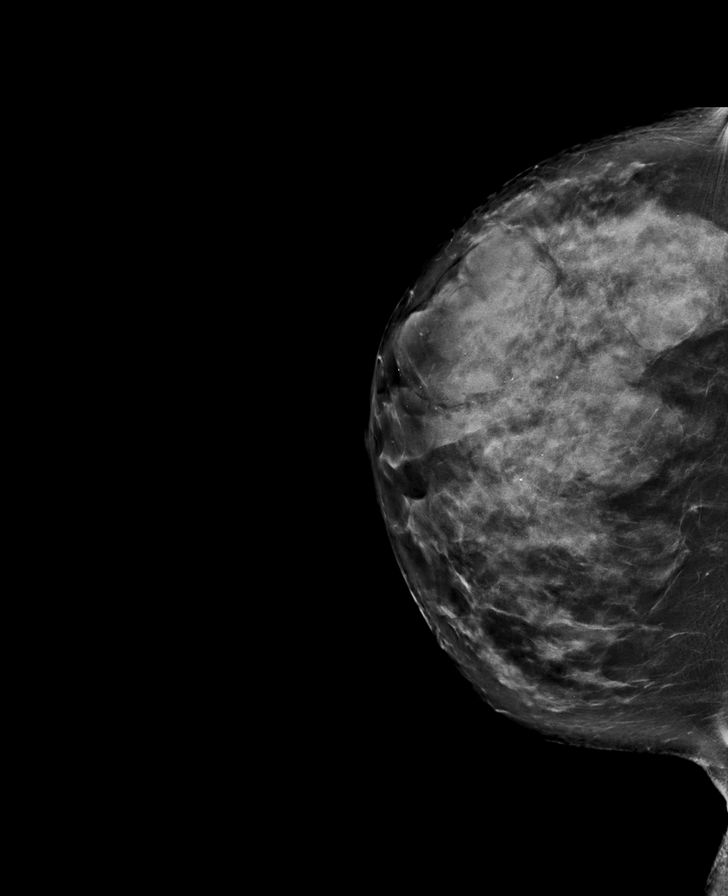

[L CC synth-2D]
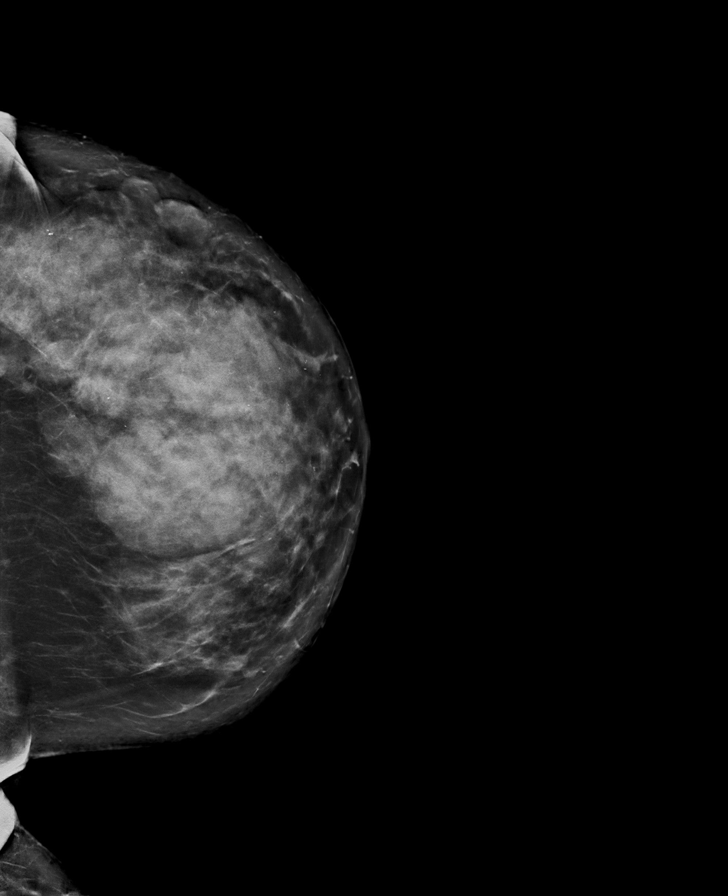

[L MLO synth-2D]
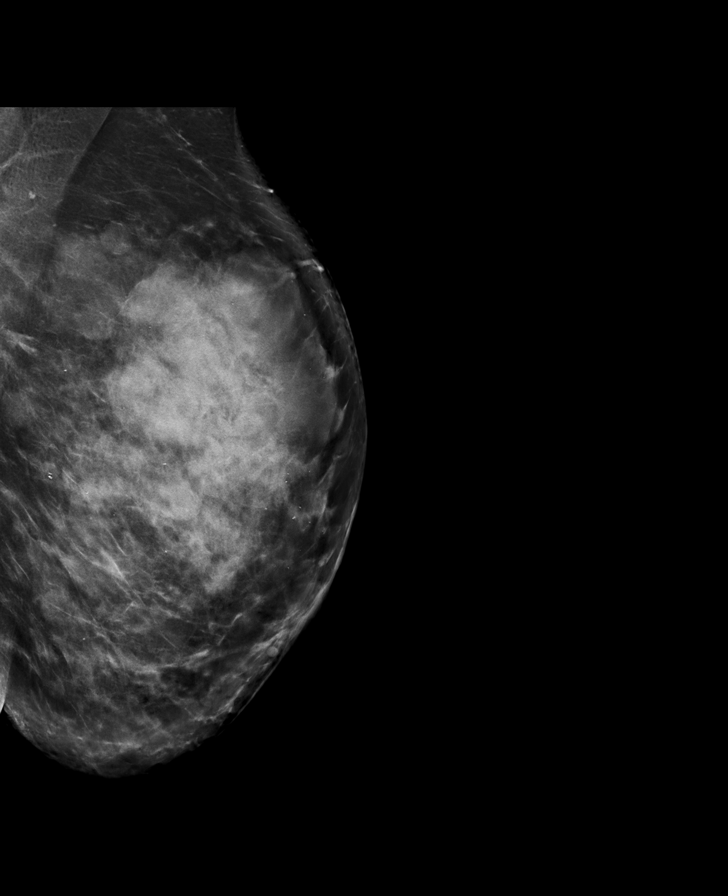

[R CC tomo · tomo slice 49/98.0]
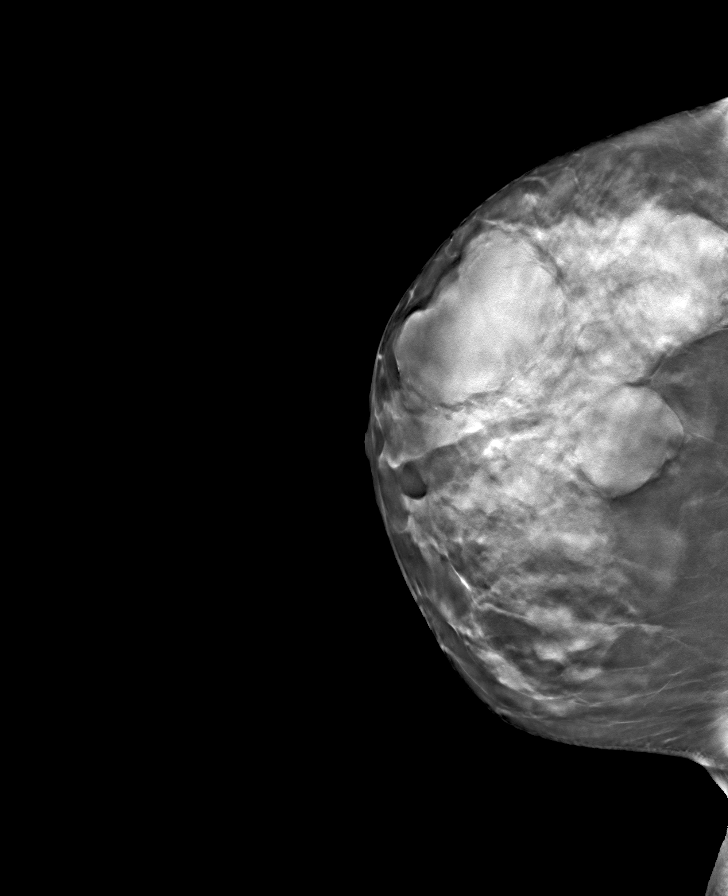

[R MLO tomo · tomo slice 52/103.0]
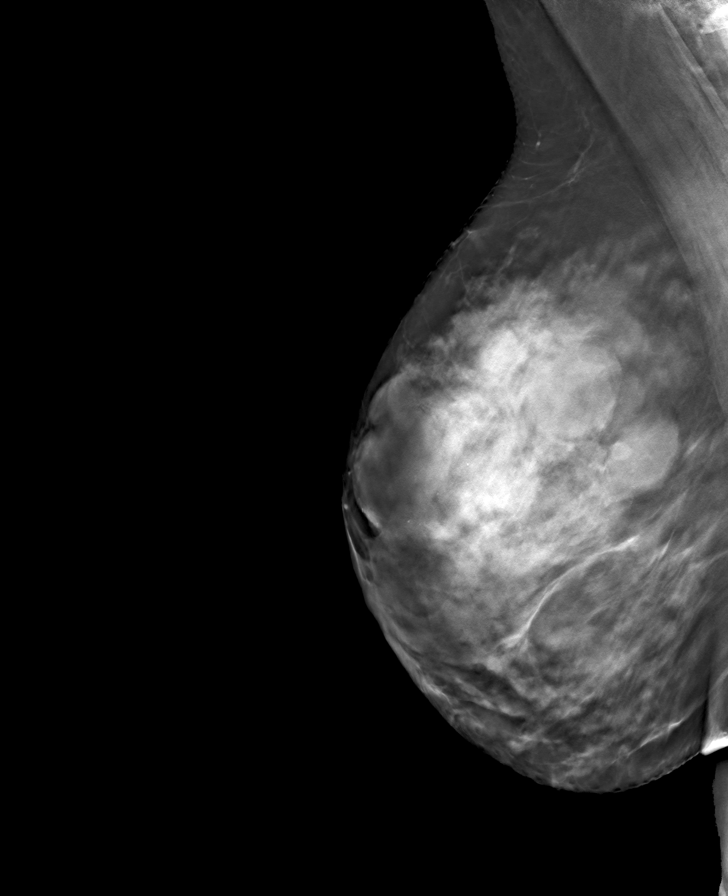

[L CC tomo · tomo slice 51/100.0]
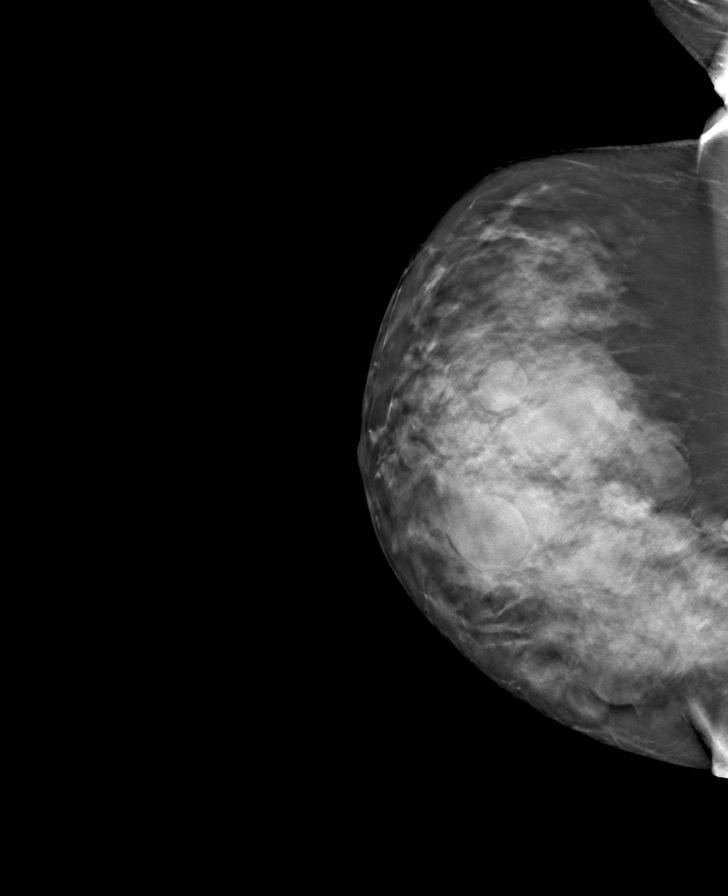

[L MLO tomo · tomo slice 53/104.0]
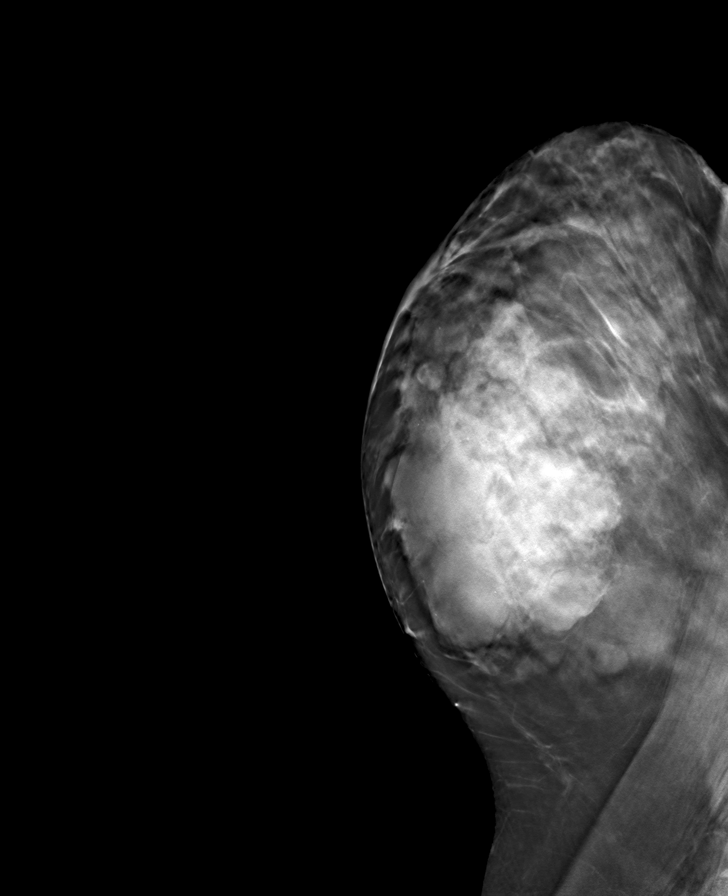

[8 of 24 positions shown; findings below may reference images not displayed]

ACR Breast Density Category d: The breast tissue is extremely dense,
which lowers the sensitivity of mammography
FINDINGS: There are no findings suspicious for malignancy. Images were
processed with CAD.
IMPRESSION: No mammographic evidence of malignancy. A result letter of this
screening mammogram will be mailed directly to the patient.

RECOMMENDATION:
Screening mammogram in one year. (Code:WO-[id number])

BI-RADS CATEGORY  1: Negative.

## 2021-07-16 ENCOUNTER — Encounter: Payer: BC Managed Care – PPO | Admitting: Certified Nurse Midwife

## 2021-09-08 ENCOUNTER — Encounter (HOSPITAL_COMMUNITY): Payer: Self-pay | Admitting: Emergency Medicine

## 2021-09-08 ENCOUNTER — Emergency Department (HOSPITAL_COMMUNITY)
Admission: EM | Admit: 2021-09-08 | Discharge: 2021-09-08 | Disposition: A | Discharge status: Home / Self Care | Payer: BLUE CROSS/BLUE SHIELD | Attending: Emergency Medicine | Admitting: Emergency Medicine

## 2021-09-08 DIAGNOSIS — S8012XA Contusion of left lower leg, initial encounter: Secondary | ICD-10-CM | POA: Insufficient documentation

## 2021-09-08 DIAGNOSIS — X58XXXA Exposure to other specified factors, initial encounter: Secondary | ICD-10-CM | POA: Diagnosis not present

## 2021-09-08 DIAGNOSIS — L819 Disorder of pigmentation, unspecified: Secondary | ICD-10-CM

## 2021-09-08 DIAGNOSIS — S8992XA Unspecified injury of left lower leg, initial encounter: Secondary | ICD-10-CM | POA: Diagnosis present

## 2021-09-08 NOTE — Discharge Instructions (Signed)
Go to [facility] for the doppler study in the morning according to the instructions provided. Keep your scheduled appointment with orthopedics for post-surgical follow up.   Return to the ED with any new or concerning symptoms.

## 2021-09-08 NOTE — ED Provider Notes (Signed)
[facility] DEPT Provider Note   CSN: [id number] Arrival date & time: 09/08/21  0000     History Chief Complaint  Patient presents with   Post-op Problem    Meredith Spence is a 55 y.o. female.  Patient to ED for evaluation of new bruising to posterior left LE she noticed today. No injury. She underwent arthroscopy on 10/11 and has been doing well since the surgery. No pain, weakness or swelling. She reports concern for blood clots.   The history is provided by the patient. No language interpreter was used.      Past Medical History:  Diagnosis Date   Allergy    Bone spur of right foot    Plantar fasciitis of right foot     Patient Active Problem List   Diagnosis Date Noted   White coat syndrome without diagnosis of hypertension 07/05/2019    Past Surgical History:  Procedure Laterality Date   BREAST CYST ASPIRATION Left    neg   COLONOSCOPY     Fibroid Cyst     POLYPECTOMY       OB History     Gravida  1   Para      Term      Preterm      AB  1   Living         SAB      IAB      Ectopic      Multiple      Live Births              Family History  Adopted: Yes  Problem Relation Age of Onset   Breast cancer Neg Hx     Social History   Tobacco Use   Smoking status: Never   Smokeless tobacco: Never  Vaping Use   Vaping Use: Never used  Substance Use Topics   Alcohol use: Yes    Alcohol/week: 1.0 standard drink    Types: 1 Glasses of wine per week    Comment: ocass   Drug use: Not Currently    Home Medications Prior to Admission medications   Medication Sig Start Date End Date Taking? Authorizing Provider  albuterol (PROVENTIL HFA;VENTOLIN HFA) 108 (90 Base) MCG/ACT inhaler Inhale 2 puffs into the lungs every 6 (six) hours as needed for wheezing or shortness of breath. Patient not taking: Reported on 01/22/2019 12/14/18   Talmage Nap, PA-C  dextromethorphan-guaiFENesin [facility] DM) 30-600  MG 12hr tablet Take 1 tablet by mouth 2 (two) times daily.    [provider]  levonorgestrel (MIRENA) 20 MCG/24HR IUD 1 each by Intrauterine route once.    [provider]  meloxicam (MOBIC) 7.5 MG tablet Take 7.5 mg by mouth daily. 05/23/20   [provider]  Multiple Vitamin (MULTIVITAMIN) capsule Take 1 capsule by mouth daily.    [provider]    Allergies    Patient has no known allergies.  Review of Systems   Review of Systems  Constitutional:  Negative for fever.  Musculoskeletal:        See HPI.  Skin:  Positive for color change.  Neurological:  Negative for numbness.   Physical Exam Updated Vital Signs BP (!) 135/103 (BP Location: Left Arm)   Pulse 80   Temp 97.6 F (36.4 C) (Oral)   Resp 15   Ht 5\' 6"  (1.676 m)   Wt 75.8 kg   SpO2 96%   BMI 26.95 kg/m   Physical  Exam Vitals and nursing note reviewed.  Constitutional:      General: She is not in acute distress.    Appearance: She is well-developed. She is not ill-appearing.  Pulmonary:     Effort: Pulmonary effort is normal.  Musculoskeletal:        General: Normal range of motion.     Cervical back: Normal range of motion.     Comments: Acute ecchymosis to posterior proximal left lower leg, posterior left knee and distal left posterior thigh. Minimal swelling. No redness or warm. No knee effusion. Distal pulses present.   Skin:    General: Skin is warm and dry.  Neurological:     Mental Status: She is alert and oriented to person, place, and time.    ED Results / Procedures / Treatments   Labs (all labs ordered are listed, but only abnormal results are displayed) Labs Reviewed - No data to display  EKG None  Radiology No results found.  Procedures Procedures   Medications Ordered in ED Medications - No data to display  ED Course  I have reviewed the triage vital signs and the nursing notes.  Pertinent labs & imaging results that were available during my  care of the patient were reviewed by me and considered in my medical decision making (see chart for details).    MDM Rules/Calculators/A&P                           Patient to ED with ss/sxs as per HPI.   Discussed with low likelihood of DVT given the type of surgery and duration since procedure. Will scheduled doppler for in the am. She has taken aspirin tonight. No lovenox indicated. Patient reassured.   Final Clinical Impression(s) / ED Diagnoses Final diagnoses:  None   Acute bruising left lower leg  Rx / DC Orders ED Discharge Orders     None        Dennie Bible 09/08/21 0117    Orpah Greek, MD 09/08/21 [phone]

## 2021-09-08 NOTE — ED Triage Notes (Signed)
Pt had left knee surgery on 10/11 and now has bruises that appeared today on the back of same leg. Pt concerned for blood clot due to not taking baby aspirin that was prescribed for after surgery.

## 2021-12-17 ENCOUNTER — Encounter: Payer: Self-pay | Admitting: Gastroenterology

## 2022-01-11 ENCOUNTER — Ambulatory Visit (AMBULATORY_SURGERY_CENTER): Payer: BLUE CROSS/BLUE SHIELD

## 2022-01-11 ENCOUNTER — Other Ambulatory Visit: Payer: Self-pay

## 2022-01-11 VITALS — Ht 66.0 in | Wt 175.0 lb

## 2022-01-11 DIAGNOSIS — Z86010 Personal history of colonic polyps: Secondary | ICD-10-CM

## 2022-01-11 MED ORDER — NA SULFATE-K SULFATE-MG SULF 17.5-3.13-1.6 GM/177ML PO SOLN: 1.0000 | Freq: Once | ORAL | 0 refills | Status: AC

## 2022-01-11 NOTE — Progress Notes (Signed)
No egg or soy allergy known to patient  No issues known to pt with past sedation with any surgeries or procedures Patient denies ever being told they had issues or difficulty with intubation  No FH of Malignant Hyperthermia Pt is not on diet pills Pt is not on home 02  Pt is not on blood thinners  Pt reports issues with constipation--patient reports she is eating fruits/veggies and occasionally takes laxatives;  No A fib or A flutter Pt is fully vaccinated for Covid x 2 + boosters; NO PA's for preps discussed with pt in PV today  Discussed with pt there will be an out-of-pocket cost for prep and that varies from $0 to 70 + dollars - pt verbalized understanding  Due to the COVID-19 pandemic we are asking patients to follow certain guidelines in PV and the [place] [facility]   Pt aware of COVID protocols and [facility] guidelines  PV completed over the phone. Pt verified name, DOB, address and insurance during PV today.  Pt mailed instruction packet with copy of consent form to read and not return, and instructions.  Pt encouraged to call with questions or issues.  If pt has My chart, procedure instructions sent via My Chart

## 2022-01-17 ENCOUNTER — Encounter: Payer: Self-pay | Admitting: Gastroenterology

## 2022-01-25 ENCOUNTER — Ambulatory Visit (AMBULATORY_SURGERY_CENTER): Payer: 59 | Admitting: Gastroenterology

## 2022-01-25 ENCOUNTER — Other Ambulatory Visit: Payer: Self-pay

## 2022-01-25 ENCOUNTER — Encounter: Payer: Self-pay | Admitting: Gastroenterology

## 2022-01-25 VITALS — BP 102/74 | HR 64 | Temp 98.4°F | Resp 16 | Ht 66.0 in | Wt 175.0 lb

## 2022-01-25 DIAGNOSIS — D120 Benign neoplasm of cecum: Secondary | ICD-10-CM

## 2022-01-25 DIAGNOSIS — D122 Benign neoplasm of ascending colon: Secondary | ICD-10-CM

## 2022-01-25 DIAGNOSIS — Z86010 Personal history of colonic polyps: Secondary | ICD-10-CM | POA: Diagnosis present

## 2022-01-25 MED ORDER — SODIUM CHLORIDE 0.9 % IV SOLN: 500.0000 mL | INTRAVENOUS | Status: DC

## 2022-01-25 NOTE — Patient Instructions (Signed)
YOU HAD AN ENDOSCOPIC PROCEDURE TODAY AT THE [facility]:   Refer to the procedure report that was given to you for any specific questions about what was found during the examination.  If the procedure report does not answer your questions, please call your gastroenterologist to clarify.  If you requested that your care partner not be given the details of your procedure findings, then the procedure report has been included in a sealed envelope for you to review at your convenience later.  YOU SHOULD EXPECT: Some feelings of bloating in the abdomen. Passage of more gas than usual.  Walking can help get rid of the air that was put into your GI tract during the procedure and reduce the bloating. If you had a lower endoscopy (such as a colonoscopy or flexible sigmoidoscopy) you may notice spotting of blood in your stool or on the toilet paper. If you underwent a bowel prep for your procedure, you may not have a normal bowel movement for a few days.  Please Note:  You might notice some irritation and congestion in your nose or some drainage.  This is from the oxygen used during your procedure.  There is no need for concern and it should clear up in a day or so.  SYMPTOMS TO REPORT IMMEDIATELY:   Following lower endoscopy (colonoscopy or flexible sigmoidoscopy):  Excessive amounts of blood in the stool  Significant tenderness or worsening of abdominal pains  Swelling of the abdomen that is new, acute  Fever of 100F or higher  For urgent or emergent issues, a gastroenterologist can be reached at any hour by calling [phone]. Do not use MyChart messaging for urgent concerns.    DIET:  We do recommend a small meal at first, but then you may proceed to your regular diet.  Drink plenty of fluids but you should avoid alcoholic beverages for 24 hours.  ACTIVITY:  You should plan to take it easy for the rest of today and you should NOT DRIVE or use heavy machinery until tomorrow (because  of the sedation medicines used during the test).    FOLLOW UP: Our staff will call the number listed on your records 48-72 hours following your procedure to check on you and address any questions or concerns that you may have regarding the information given to you following your procedure. If we do not reach you, we will leave a message.  We will attempt to reach you two times.  During this call, we will ask if you have developed any symptoms of COVID 19. If you develop any symptoms (ie: fever, flu-like symptoms, shortness of breath, cough etc.) before then, please call [phone].  If you test positive for Covid 19 in the 2 weeks post procedure, please call and report this information to us.    If any biopsies were taken you will be contacted by phone or by letter within the next 1-3 weeks.  Please call us at [phone] if you have not heard about the biopsies in 3 weeks.    SIGNATURES/CONFIDENTIALITY: You and/or your care partner have signed paperwork which will be entered into your electronic medical record.  These signatures attest to the fact that that the information above on your After Visit Summary has been reviewed and is understood.  Full responsibility of the confidentiality of this discharge information lies with you and/or your care-partner. 

## 2022-01-25 NOTE — Progress Notes (Signed)
[facility] History and Physical ? ? ?Primary Care Physician:  Pcp, No ? ? ?Reason for Procedure:  History of adenomatous colon polyps ? ?Plan:    Surveillance colonoscopy with possible interventions as needed ? ? ? ? ?HPI: Meredith Spence is a very pleasant 56 y.o. female here for surveillance colonoscopy. ?Denies any nausea, vomiting, abdominal pain, melena or bright red blood per rectum ? ?The risks and benefits as well as alternatives of endoscopic procedure(s) have been discussed and reviewed. All questions answered. The patient agrees to proceed. ? ? ? ?Past Medical History:  ?Diagnosis Date  ? Arthritis   ? knees  ? Bone spur of right foot   ? Plantar fasciitis of right foot   ? ? ?Past Surgical History:  ?Procedure Laterality Date  ? BREAST CYST ASPIRATION Left   ? neg  ? COLONOSCOPY  2020  ? KN-MAC-suprepr/miralax (prep good)-polyps  ? Fibroid Cyst    ? KNEE ARTHROSCOPY Left 2022  ? POLYPECTOMY  2020  ? polyps  ? ? ?Prior to Admission medications   ?Medication Sig Start Date End Date Taking? Authorizing Provider  ?levonorgestrel (MIRENA) 20 MCG/24HR IUD 1 each by Intrauterine route once.   Yes [provider]  ?Multiple Vitamin (MULTIVITAMIN) capsule Take 1 capsule by mouth daily.    [provider]  ? ? ?Current Outpatient Medications  ?Medication Sig Dispense Refill  ? levonorgestrel (MIRENA) 20 MCG/24HR IUD 1 each by Intrauterine route once.    ? Multiple Vitamin (MULTIVITAMIN) capsule Take 1 capsule by mouth daily.    ? ?Current Facility-Administered Medications  ?Medication Dose Route Frequency Provider Last Rate Last Admin  ? 0.9 %  sodium chloride infusion  500 mL Intravenous Continuous Armbruster, Carlota Raspberry, MD      ? ? ?Allergies as of 01/25/2022  ? (No Known Allergies)  ? ? ?Family History  ?Adopted: Yes  ?Problem Relation Age of Onset  ? Breast cancer Neg Hx   ? ? ?Social History  ? ?Socioeconomic History  ? Marital status: Married  ?  Spouse name: Not on file  ? Number  of children: Not on file  ? Years of education: Not on file  ? Highest education level: Not on file  ?Occupational History  ? Occupation: Scientist, water quality  ?  Employer: [employer]  ?Tobacco Use  ? Smoking status: Never  ? Smokeless tobacco: Never  ?Vaping Use  ? Vaping Use: Never used  ?Substance and Sexual Activity  ? Alcohol use: Yes  ?  Alcohol/week: 7.0 standard drinks  ?  Types: 7 Glasses of wine per week  ?  Comment: ocass  ? Drug use: Not Currently  ? Sexual activity: Yes  ?  Birth control/protection: I.U.D.  ?Other Topics Concern  ? Not on file  ?Social History Narrative  ? Not on file  ? ?Social Determinants of Health  ? ?Financial Resource Strain: Not on file  ?Food Insecurity: Not on file  ?Transportation Needs: Not on file  ?Physical Activity: Not on file  ?Stress: Not on file  ?Social Connections: Not on file  ?Intimate Partner Violence: Not on file  ? ? ?Review of Systems: ? ?All other review of systems negative except as mentioned in the HPI. ? ?Physical Exam: ?Vital signs in last 24 hours: ?BP 130/86   Pulse 79   Temp 98.4 ?F (36.9 ?C) (Temporal)   Ht _0  (1.676 m)   Wt 175 lb (79.4 kg)   SpO2 97%   BMI 28.25  kg/m?  ?General:   Alert, NAD ?Lungs:  Clear .   ?Heart:  Regular rate and rhythm ?Abdomen:  Soft, nontender and nondistended. ?Neuro/Psych:  Alert and cooperative. Normal mood and affect. A and O x 3 ? ?Reviewed labs, radiology imaging, old records and pertinent past GI work up ? ?Patient is appropriate for planned procedure(s) and anesthesia in an ambulatory setting ? ? ?K. Denzil Magnuson , MD ?[phone]  ? ? ?  ?

## 2022-01-25 NOTE — Progress Notes (Signed)
Pt's states no medical or surgical changes since previsit or office visit. 

## 2022-01-25 NOTE — Progress Notes (Signed)
Called to room to assist during endoscopic procedure.  Patient ID and intended procedure confirmed with present staff. Received instructions for my participation in the procedure from the performing physician.  

## 2022-01-25 NOTE — Op Note (Signed)
[facility] ?Patient Name: Meredith Spence ?Procedure Date: 01/25/2022 1:35 PM ?MRN: [id number] ?Endoscopist: Mauri Pole , MD ?Age: 56 ?Referring MD:  ?Date of Birth: [birth year] ?Gender: Female ?Account #: [id number] ?Procedure:                Colonoscopy ?Indications:              High risk colon cancer surveillance: Personal  ?                          history of colonic polyps, High risk colon cancer  ?                          surveillance: Personal history of adenoma (10 mm or  ?                          greater in size), High risk colon cancer  ?                          surveillance: Personal history of multiple (3 or  ?                          more) adenomas ?Medicines:                Monitored Anesthesia Care ?Procedure:                Pre-Anesthesia Assessment: ?                          - Prior to the procedure, a History and Physical  ?                          was performed, and patient medications and  ?                          allergies were reviewed. The patient's tolerance of  ?                          previous anesthesia was also reviewed. The risks  ?                          and benefits of the procedure and the sedation  ?                          options and risks were discussed with the patient.  ?                          All questions were answered, and informed consent  ?                          was obtained. Prior Anticoagulants: The patient has  ?                          taken no previous anticoagulant or antiplatelet  ?  agents. ASA Grade Assessment: II - A patient with  ?                          mild systemic disease. After reviewing the risks  ?                          and benefits, the patient was deemed in  ?                          satisfactory condition to undergo the procedure. ?                          After obtaining informed consent, the colonoscope  ?                          was passed under direct vision. Throughout the  ?                           procedure, the patient's blood pressure, pulse, and  ?                          oxygen saturations were monitored continuously. The  ?                          PCF-HQ190L Colonoscope was introduced through the  ?                          anus and advanced to the the cecum, identified by  ?                          appendiceal orifice and ileocecal valve. The  ?                          colonoscopy was performed without difficulty. The  ?                          patient tolerated the procedure well. The quality  ?                          of the bowel preparation was excellent. The  ?                          ileocecal valve, appendiceal orifice, and rectum  ?                          were photographed. ?Scope In: 1:37:52 PM ?Scope Out: 1:57:25 PM ?Scope Withdrawal Time: 0 hours 11 minutes 25 seconds  ?Total Procedure Duration: 0 hours 19 minutes 33 seconds  ?Findings:                 The perianal and digital rectal examinations were  ?                          normal. ?  A 4 mm polyp was found in the ascending colon. The  ?                          polyp was sessile. The polyp was removed with a  ?                          cold snare. Resection and retrieval were complete. ?                          Four sessile polyps were found in the ascending  ?                          colon and cecum. The polyps were 1 to 2 mm in size.  ?                          These polyps were removed with a cold biopsy  ?                          forceps. Resection and retrieval were complete. ?                          Multiple small and large-mouthed diverticula were  ?                          found in the sigmoid colon. ?                          Non-bleeding external and internal hemorrhoids were  ?                          found during retroflexion. The hemorrhoids were  ?                          small. ?Complications:            No immediate complications. ?Estimated Blood Loss:      Estimated blood loss was minimal. ?Impression:               - One 4 mm polyp in the ascending colon, removed  ?                          with a cold snare. Resected and retrieved. ?                          - Four 1 to 2 mm polyps in the ascending colon and  ?                          in the cecum, removed with a cold biopsy forceps.  ?                          Resected and retrieved. ?                          - Moderate diverticulosis in  the sigmoid colon. ?                          - Non-bleeding external and internal hemorrhoids. ?Recommendation:           - Patient has a contact number available for  ?                          emergencies. The signs and symptoms of potential  ?                          delayed complications were discussed with the  ?                          patient. Return to normal activities tomorrow.  ?                          Written discharge instructions were provided to the  ?                          patient. ?                          - Resume previous diet. ?                          - Continue present medications. ?                          - Await pathology results. ?                          - Repeat colonoscopy in 3 - 5 years for  ?                          surveillance based on pathology results. ?Mauri Pole, MD ?01/25/2022 2:03:41 PM ?This report has been signed electronically. ?

## 2022-01-25 NOTE — Progress Notes (Signed)
Report to PACU, RN, vss, BBS= Clear.  

## 2022-01-29 ENCOUNTER — Telehealth: Payer: Self-pay

## 2022-01-29 NOTE — Telephone Encounter (Signed)
?  Follow up Call- ? ?Call back number 01/25/2022  ?Post procedure Call Back phone  # [phone]  ?Permission to leave phone message Yes  ?Some recent data might be hidden  ?  ? ?Patient questions: ? ?Do you have a fever, pain , or abdominal swelling? No. ?Pain Score  0 * ? ?Have you tolerated food without any problems? Yes.   ? ?Have you been able to return to your normal activities? Yes.   ? ?Do you have any questions about your discharge instructions: ?Diet   No. ?Medications  No. ?Follow up visit  No. ? ?Do you have questions or concerns about your Care? No. ? ?Actions: ?* If pain score is 4 or above: ?No action needed, pain <4. ? ? ?

## 2022-01-31 ENCOUNTER — Encounter: Payer: Self-pay | Admitting: Gastroenterology
